# Patient Record
Sex: Male | Born: 1935 | Race: White | Hispanic: No
Health system: Southern US, Community
[De-identification: ages and names within clinical notes are randomized; demographics above are authoritative.]

---

## 2006-01-06 ENCOUNTER — Ambulatory Visit: Payer: Self-pay | Admitting: Specialist

## 2007-04-12 ENCOUNTER — Ambulatory Visit: Payer: Self-pay | Admitting: Family Medicine

## 2009-04-16 ENCOUNTER — Ambulatory Visit: Payer: Self-pay | Admitting: Family Medicine

## 2011-03-18 ENCOUNTER — Ambulatory Visit: Payer: Self-pay | Admitting: Family Medicine

## 2011-04-13 ENCOUNTER — Emergency Department: Payer: Self-pay | Admitting: Emergency Medicine

## 2011-07-14 ENCOUNTER — Ambulatory Visit: Payer: Self-pay | Admitting: Family Medicine

## 2011-08-21 ENCOUNTER — Ambulatory Visit: Payer: Self-pay | Admitting: Family Medicine

## 2011-08-28 ENCOUNTER — Ambulatory Visit: Payer: Self-pay | Admitting: Family Medicine

## 2011-09-01 ENCOUNTER — Emergency Department: Payer: Self-pay | Admitting: Emergency Medicine

## 2011-10-01 ENCOUNTER — Encounter: Payer: Self-pay | Admitting: Family Medicine

## 2011-12-01 ENCOUNTER — Ambulatory Visit: Payer: Self-pay | Admitting: Family Medicine

## 2012-05-19 ENCOUNTER — Emergency Department: Payer: Self-pay | Admitting: Emergency Medicine

## 2012-05-19 LAB — COMPREHENSIVE METABOLIC PANEL
Albumin: 2.9 g/dL — ABNORMAL LOW (ref 3.4–5.0)
Alkaline Phosphatase: 82 U/L (ref 50–136)
Anion Gap: 8 (ref 7–16)
Bilirubin,Total: 0.3 mg/dL (ref 0.2–1.0)
Chloride: 110 mmol/L — ABNORMAL HIGH (ref 98–107)
Co2: 25 mmol/L (ref 21–32)
Creatinine: 0.94 mg/dL (ref 0.60–1.30)
EGFR (African American): >60
Glucose: 127 mg/dL — ABNORMAL HIGH (ref 65–99)
Osmolality: 288 (ref 275–301)
Potassium: 4 mmol/L (ref 3.5–5.1)
SGPT (ALT): 10 U/L — ABNORMAL LOW
Total Protein: 6.2 g/dL — ABNORMAL LOW (ref 6.4–8.2)

## 2012-05-19 LAB — APTT: Activated PTT: 35.3 secs (ref 23.6–35.9)

## 2012-05-19 LAB — CBC
HCT: 34.3 % — ABNORMAL LOW (ref 40.0–52.0)
HGB: 11.5 g/dL — ABNORMAL LOW (ref 13.0–18.0)
Platelet: 124 10*3/uL — ABNORMAL LOW (ref 150–440)

## 2012-05-19 LAB — PROTIME-INR: Prothrombin Time: 14.2 secs (ref 11.5–14.7)

## 2012-06-07 ENCOUNTER — Ambulatory Visit: Payer: Self-pay | Admitting: Specialist

## 2012-06-07 LAB — CREATININE, SERUM
Creatinine: 0.74 mg/dL (ref 0.60–1.30)
EGFR (African American): >60

## 2012-10-14 ENCOUNTER — Emergency Department: Payer: Self-pay | Admitting: Internal Medicine

## 2012-10-14 LAB — COMPREHENSIVE METABOLIC PANEL
Alkaline Phosphatase: 87 U/L (ref 50–136)
Anion Gap: 8 (ref 7–16)
Bilirubin,Total: 0.5 mg/dL (ref 0.2–1.0)
Calcium, Total: 8 mg/dL — ABNORMAL LOW (ref 8.5–10.1)
Co2: 27 mmol/L (ref 21–32)
EGFR (African American): >60
EGFR (Non-African Amer.): >60
Glucose: 119 mg/dL — ABNORMAL HIGH (ref 65–99)
Osmolality: 285 (ref 275–301)
Sodium: 142 mmol/L (ref 136–145)

## 2012-10-14 LAB — CBC
HCT: 35.6 % — ABNORMAL LOW (ref 40.0–52.0)
MCH: 31 pg (ref 26.0–34.0)
MCHC: 34.3 g/dL (ref 32.0–36.0)
MCV: 91 fL (ref 80–100)
Platelet: 96 10*3/uL — ABNORMAL LOW (ref 150–440)
RBC: 3.93 10*6/uL — ABNORMAL LOW (ref 4.40–5.90)
WBC: 2.9 10*3/uL — ABNORMAL LOW (ref 3.8–10.6)

## 2012-10-19 LAB — CULTURE, BLOOD (SINGLE)

## 2013-12-16 LAB — URINALYSIS, COMPLETE
Bacteria: NONE SEEN
Glucose,UR: NEGATIVE mg/dL (ref 0–75)
Protein: NEGATIVE
RBC,UR: 19 /HPF (ref 0–5)
Specific Gravity: 1.016 (ref 1.003–1.030)
Squamous Epithelial: NONE SEEN
WBC UR: 1 /HPF (ref 0–5)

## 2013-12-16 LAB — CBC
HCT: 36.2 % — ABNORMAL LOW (ref 40.0–52.0)
MCH: 31.2 pg (ref 26.0–34.0)
Platelet: 100 10*3/uL — ABNORMAL LOW (ref 150–440)
RDW: 14.6 % — ABNORMAL HIGH (ref 11.5–14.5)
WBC: 8.1 10*3/uL (ref 3.8–10.6)

## 2013-12-16 LAB — COMPREHENSIVE METABOLIC PANEL
Albumin: 3.3 g/dL — ABNORMAL LOW (ref 3.4–5.0)
Alkaline Phosphatase: 65 U/L
Anion Gap: 5 — ABNORMAL LOW (ref 7–16)
BUN: 15 mg/dL (ref 7–18)
Bilirubin,Total: 0.5 mg/dL (ref 0.2–1.0)
Creatinine: 1.09 mg/dL (ref 0.60–1.30)
EGFR (African American): >60
EGFR (Non-African Amer.): 60 — ABNORMAL LOW
Potassium: 4.8 mmol/L (ref 3.5–5.1)
SGPT (ALT): 12 U/L (ref 12–78)
Total Protein: 7 g/dL (ref 6.4–8.2)

## 2013-12-17 ENCOUNTER — Ambulatory Visit: Payer: Self-pay | Admitting: Student

## 2013-12-17 ENCOUNTER — Inpatient Hospital Stay: Payer: Self-pay | Admitting: Internal Medicine

## 2013-12-17 LAB — DIFFERENTIAL
Bands: 6 %
Eosinophil: 1 %
Lymphocytes: 9 %
Metamyelocyte: 3 %
Monocytes: 1 %
Myelocyte: 2 %
Segmented Neutrophils: 78 %

## 2013-12-17 LAB — CBC WITH DIFFERENTIAL/PLATELET
Bands: 13 %
HCT: 34.2 % — ABNORMAL LOW (ref 40.0–52.0)
Lymphocytes: 4 %
MCV: 92 fL (ref 80–100)
Metamyelocyte: 7 %
Monocytes: 1 %
Myelocyte: 2 %
Platelet: 76 10*3/uL — ABNORMAL LOW (ref 150–440)
Segmented Neutrophils: 73 %
WBC: 12 10*3/uL — ABNORMAL HIGH (ref 3.8–10.6)

## 2013-12-18 LAB — URINE CULTURE

## 2013-12-18 LAB — CBC WITH DIFFERENTIAL/PLATELET
Bands: 5 %
HCT: 28.9 % — ABNORMAL LOW (ref 40.0–52.0)
HGB: 9.8 g/dL — ABNORMAL LOW (ref 13.0–18.0)
Lymphocytes: 3 %
MCH: 31.3 pg (ref 26.0–34.0)
MCHC: 33.8 g/dL (ref 32.0–36.0)
MCV: 93 fL (ref 80–100)
Metamyelocyte: 5 %
Myelocyte: 2 %
Platelet: 70 10*3/uL — ABNORMAL LOW (ref 150–440)
RDW: 14.7 % — ABNORMAL HIGH (ref 11.5–14.5)
Segmented Neutrophils: 85 %

## 2013-12-18 LAB — RAPID INFLUENZA A&B ANTIGENS

## 2013-12-18 LAB — COMPREHENSIVE METABOLIC PANEL
Albumin: 2.4 g/dL — ABNORMAL LOW (ref 3.4–5.0)
Alkaline Phosphatase: 44 U/L — ABNORMAL LOW
BUN: 15 mg/dL (ref 7–18)
Bilirubin,Total: 0.4 mg/dL (ref 0.2–1.0)
Calcium, Total: 7.1 mg/dL — ABNORMAL LOW (ref 8.5–10.1)
Chloride: 107 mmol/L (ref 98–107)
Creatinine: 1.04 mg/dL (ref 0.60–1.30)
EGFR (Non-African Amer.): >60
Glucose: 119 mg/dL — ABNORMAL HIGH (ref 65–99)
Osmolality: 274 (ref 275–301)
Potassium: 3.5 mmol/L (ref 3.5–5.1)
SGOT(AST): 18 U/L (ref 15–37)
Sodium: 136 mmol/L (ref 136–145)
Total Protein: 5.5 g/dL — ABNORMAL LOW (ref 6.4–8.2)

## 2013-12-18 LAB — TSH: Thyroid Stimulating Horm: 1.9 u[IU]/mL

## 2013-12-19 LAB — CBC WITH DIFFERENTIAL/PLATELET
Comment - H1-Com4: NORMAL
HCT: 26.9 % — ABNORMAL LOW (ref 40.0–52.0)
HGB: 9.3 g/dL — ABNORMAL LOW (ref 13.0–18.0)
Lymphocytes: 31 %
MCHC: 34.5 g/dL (ref 32.0–36.0)
MCV: 91 fL (ref 80–100)
Monocytes: 1 %
Platelet: 65 10*3/uL — ABNORMAL LOW (ref 150–440)
RDW: 14.6 % — ABNORMAL HIGH (ref 11.5–14.5)
Segmented Neutrophils: 62 %
Variant Lymphocyte - H1-Rlymph: 1 %
WBC: 5.5 10*3/uL (ref 3.8–10.6)

## 2013-12-22 LAB — CULTURE, BLOOD (SINGLE)

## 2014-01-19 ENCOUNTER — Ambulatory Visit: Payer: Self-pay | Admitting: Oncology

## 2014-01-19 LAB — CBC CANCER CENTER
BASOS PCT: 0.4 %
Basophil #: 0 x10 3/mm (ref 0.0–0.1)
EOS ABS: 0 x10 3/mm (ref 0.0–0.7)
EOS PCT: 0.6 %
HCT: 30.4 % — AB (ref 40.0–52.0)
HGB: 9.8 g/dL — ABNORMAL LOW (ref 13.0–18.0)
LYMPHS PCT: 16.7 %
Lymphocyte #: 1 x10 3/mm (ref 1.0–3.6)
MCH: 30.5 pg (ref 26.0–34.0)
MCHC: 32.2 g/dL (ref 32.0–36.0)
MCV: 95 fL (ref 80–100)
MONO ABS: 0.2 x10 3/mm (ref 0.2–1.0)
MONOS PCT: 2.9 %
NEUTROS PCT: 79.4 %
Neutrophil #: 4.6 x10 3/mm (ref 1.4–6.5)
Platelet: 89 x10 3/mm — ABNORMAL LOW (ref 150–440)
RBC: 3.21 10*6/uL — ABNORMAL LOW (ref 4.40–5.90)
RDW: 16 % — ABNORMAL HIGH (ref 11.5–14.5)
WBC: 5.8 x10 3/mm (ref 3.8–10.6)

## 2014-01-22 ENCOUNTER — Ambulatory Visit: Payer: Self-pay | Admitting: Oncology

## 2014-03-15 ENCOUNTER — Ambulatory Visit: Payer: Self-pay | Admitting: Oncology

## 2014-03-29 ENCOUNTER — Ambulatory Visit: Payer: Self-pay | Admitting: Oncology

## 2014-03-30 LAB — CBC CANCER CENTER
Basophil #: 0 x10 3/mm (ref 0.0–0.1)
Basophil %: 0.8 %
Eosinophil #: 0 x10 3/mm (ref 0.0–0.7)
Eosinophil %: 0.7 %
HCT: 35.6 % — AB (ref 40.0–52.0)
HGB: 11.5 g/dL — AB (ref 13.0–18.0)
LYMPHS PCT: 30.1 %
Lymphocyte #: 1.3 x10 3/mm (ref 1.0–3.6)
MCH: 30.1 pg (ref 26.0–34.0)
MCHC: 32.2 g/dL (ref 32.0–36.0)
MCV: 93 fL (ref 80–100)
Monocyte #: 0.1 x10 3/mm — ABNORMAL LOW (ref 0.2–1.0)
Monocyte %: 2.5 %
NEUTROS ABS: 2.8 x10 3/mm (ref 1.4–6.5)
Neutrophil %: 65.9 %
Platelet: 78 x10 3/mm — ABNORMAL LOW (ref 150–440)
RBC: 3.82 10*6/uL — ABNORMAL LOW (ref 4.40–5.90)
RDW: 15.2 % — ABNORMAL HIGH (ref 11.5–14.5)
WBC: 4.3 x10 3/mm (ref 3.8–10.6)

## 2014-03-30 LAB — IRON AND TIBC
IRON BIND. CAP.(TOTAL): 419 ug/dL (ref 250–450)
IRON: 90 ug/dL (ref 65–175)
Iron Saturation: 21 %
Unbound Iron-Bind.Cap.: 329 ug/dL

## 2014-03-30 LAB — FOLATE: Folic Acid: 4.8 ng/mL (ref 3.1–100.0)

## 2014-03-30 LAB — FERRITIN: FERRITIN (ARMC): 110 ng/mL (ref 8–388)

## 2014-03-30 LAB — LACTATE DEHYDROGENASE: LDH: 173 U/L (ref 85–241)

## 2014-04-21 ENCOUNTER — Ambulatory Visit: Payer: Self-pay | Admitting: Oncology

## 2014-04-25 ENCOUNTER — Ambulatory Visit: Payer: Self-pay | Admitting: Family Medicine

## 2014-05-12 ENCOUNTER — Inpatient Hospital Stay: Payer: Self-pay | Admitting: Internal Medicine

## 2014-05-12 LAB — CBC WITH DIFFERENTIAL/PLATELET
BASOS ABS: 0 10*3/uL (ref 0.0–0.1)
Basophil %: 0.1 %
Eosinophil #: 0 10*3/uL (ref 0.0–0.7)
Eosinophil %: 0.1 %
HCT: 38.3 % — ABNORMAL LOW (ref 40.0–52.0)
HGB: 12.1 g/dL — AB (ref 13.0–18.0)
Lymphocyte #: 0.5 10*3/uL — ABNORMAL LOW (ref 1.0–3.6)
Lymphocyte %: 2.6 %
MCH: 30.4 pg (ref 26.0–34.0)
MCHC: 31.7 g/dL — ABNORMAL LOW (ref 32.0–36.0)
MCV: 96 fL (ref 80–100)
MONO ABS: 0.1 x10 3/mm — AB (ref 0.2–1.0)
Monocyte %: 0.8 %
Neutrophil #: 18.2 10*3/uL — ABNORMAL HIGH (ref 1.4–6.5)
Neutrophil %: 96.4 %
Platelet: 90 10*3/uL — ABNORMAL LOW (ref 150–440)
RBC: 3.99 10*6/uL — AB (ref 4.40–5.90)
RDW: 15.4 % — ABNORMAL HIGH (ref 11.5–14.5)
WBC: 18.8 10*3/uL — ABNORMAL HIGH (ref 3.8–10.6)

## 2014-05-12 LAB — COMPREHENSIVE METABOLIC PANEL
ALBUMIN: 3 g/dL — AB (ref 3.4–5.0)
AST: 22 U/L (ref 15–37)
Alkaline Phosphatase: 66 U/L
Anion Gap: 6 — ABNORMAL LOW (ref 7–16)
BILIRUBIN TOTAL: 0.4 mg/dL (ref 0.2–1.0)
BUN: 20 mg/dL — ABNORMAL HIGH (ref 7–18)
CREATININE: 1.4 mg/dL — AB (ref 0.60–1.30)
Calcium, Total: 7.5 mg/dL — ABNORMAL LOW (ref 8.5–10.1)
Chloride: 109 mmol/L — ABNORMAL HIGH (ref 98–107)
Co2: 25 mmol/L (ref 21–32)
EGFR (African American): 51 — ABNORMAL LOW
EGFR (Non-African Amer.): 44 — ABNORMAL LOW
GLUCOSE: 103 mg/dL — AB (ref 65–99)
Osmolality: 282 (ref 275–301)
POTASSIUM: 3.9 mmol/L (ref 3.5–5.1)
SGPT (ALT): 8 U/L — ABNORMAL LOW (ref 12–78)
Sodium: 140 mmol/L (ref 136–145)
TOTAL PROTEIN: 6.4 g/dL (ref 6.4–8.2)

## 2014-05-12 LAB — CK-MB
CK-MB: 0.8 ng/mL (ref 0.5–3.6)
CK-MB: 0.9 ng/mL (ref 0.5–3.6)

## 2014-05-12 LAB — PRO B NATRIURETIC PEPTIDE: B-TYPE NATIURETIC PEPTID: 890 pg/mL — AB (ref 0–450)

## 2014-05-12 LAB — GENTAMICIN LEVEL, TROUGH: Gentamicin, Trough: 16.9 ug/mL (ref 0.0–2.0)

## 2014-05-12 LAB — TROPONIN I
Troponin-I: <0.02 ng/mL
Troponin-I: <0.02 ng/mL

## 2014-05-13 LAB — CBC WITH DIFFERENTIAL/PLATELET
BASOS ABS: 0.1 10*3/uL (ref 0.0–0.1)
BASOS PCT: 0.2 %
Eosinophil #: 0 10*3/uL (ref 0.0–0.7)
Eosinophil %: 0.1 %
HCT: 29.2 % — ABNORMAL LOW (ref 40.0–52.0)
HGB: 9.4 g/dL — ABNORMAL LOW (ref 13.0–18.0)
Lymphocyte #: 1.5 10*3/uL (ref 1.0–3.6)
Lymphocyte %: 4.8 %
MCH: 30.7 pg (ref 26.0–34.0)
MCHC: 32.1 g/dL (ref 32.0–36.0)
MCV: 96 fL (ref 80–100)
MONO ABS: 0.3 x10 3/mm (ref 0.2–1.0)
Monocyte %: 1 %
NEUTROS ABS: 30.1 10*3/uL — AB (ref 1.4–6.5)
NEUTROS PCT: 93.9 %
Platelet: 85 10*3/uL — ABNORMAL LOW (ref 150–440)
RBC: 3.06 10*6/uL — ABNORMAL LOW (ref 4.40–5.90)
RDW: 15.6 % — ABNORMAL HIGH (ref 11.5–14.5)
WBC: 32.1 10*3/uL — ABNORMAL HIGH (ref 3.8–10.6)

## 2014-05-13 LAB — MAGNESIUM: Magnesium: 2 mg/dL

## 2014-05-13 LAB — BASIC METABOLIC PANEL
Anion Gap: 5 — ABNORMAL LOW (ref 7–16)
BUN: 22 mg/dL — ABNORMAL HIGH (ref 7–18)
CO2: 23 mmol/L (ref 21–32)
Calcium, Total: 7.5 mg/dL — ABNORMAL LOW (ref 8.5–10.1)
Chloride: 106 mmol/L (ref 98–107)
Creatinine: 1.42 mg/dL — ABNORMAL HIGH (ref 0.60–1.30)
GFR CALC AF AMER: 50 — AB
GFR CALC NON AF AMER: 43 — AB
Glucose: 83 mg/dL (ref 65–99)
Osmolality: 271 (ref 275–301)
Potassium: 3.8 mmol/L (ref 3.5–5.1)
Sodium: 134 mmol/L — ABNORMAL LOW (ref 136–145)

## 2014-05-14 LAB — CBC WITH DIFFERENTIAL/PLATELET
BASOS PCT: 0.1 %
Basophil #: 0 10*3/uL (ref 0.0–0.1)
EOS PCT: 0.1 %
Eosinophil #: 0 10*3/uL (ref 0.0–0.7)
HCT: 27.1 % — AB (ref 40.0–52.0)
HGB: 8.8 g/dL — ABNORMAL LOW (ref 13.0–18.0)
LYMPHS ABS: 0.8 10*3/uL — AB (ref 1.0–3.6)
Lymphocyte %: 5.7 %
MCH: 30.8 pg (ref 26.0–34.0)
MCHC: 32.5 g/dL (ref 32.0–36.0)
MCV: 95 fL (ref 80–100)
MONO ABS: 0.2 x10 3/mm (ref 0.2–1.0)
Monocyte %: 1.2 %
NEUTROS ABS: 12.8 10*3/uL — AB (ref 1.4–6.5)
NEUTROS PCT: 92.9 %
Platelet: 76 10*3/uL — ABNORMAL LOW (ref 150–440)
RBC: 2.86 10*6/uL — ABNORMAL LOW (ref 4.40–5.90)
RDW: 15.6 % — AB (ref 11.5–14.5)
WBC: 13.8 10*3/uL — ABNORMAL HIGH (ref 3.8–10.6)

## 2014-05-14 LAB — BASIC METABOLIC PANEL
ANION GAP: 1 — AB (ref 7–16)
BUN: 16 mg/dL (ref 7–18)
CREATININE: 1.14 mg/dL (ref 0.60–1.30)
Calcium, Total: 7.7 mg/dL — ABNORMAL LOW (ref 8.5–10.1)
Chloride: 112 mmol/L — ABNORMAL HIGH (ref 98–107)
Co2: 24 mmol/L (ref 21–32)
EGFR (African American): >60
GFR CALC NON AF AMER: 56 — AB
Glucose: 88 mg/dL (ref 65–99)
Osmolality: 274 (ref 275–301)
Potassium: 4 mmol/L (ref 3.5–5.1)
Sodium: 137 mmol/L (ref 136–145)

## 2014-05-14 LAB — GENTAMICIN LEVEL, RANDOM: GENTAMICIN, RANDOM: 0.5 ug/mL

## 2014-05-16 LAB — CBC WITH DIFFERENTIAL/PLATELET
Bands: 3 %
EOS PCT: 1 %
HCT: 27 % — ABNORMAL LOW (ref 40.0–52.0)
HGB: 9.1 g/dL — ABNORMAL LOW (ref 13.0–18.0)
Lymphocytes: 7 %
MCH: 32 pg (ref 26.0–34.0)
MCHC: 33.9 g/dL (ref 32.0–36.0)
MCV: 95 fL (ref 80–100)
METAMYELOCYTE: 5 %
MONOS PCT: 2 %
MYELOCYTE: 2 %
Platelet: 85 10*3/uL — ABNORMAL LOW (ref 150–440)
RBC: 2.85 10*6/uL — AB (ref 4.40–5.90)
RDW: 15.3 % — ABNORMAL HIGH (ref 11.5–14.5)
Segmented Neutrophils: 80 %
WBC: 9.3 10*3/uL (ref 3.8–10.6)

## 2014-05-16 LAB — BASIC METABOLIC PANEL
Anion Gap: 5 — ABNORMAL LOW (ref 7–16)
BUN: 8 mg/dL (ref 7–18)
CALCIUM: 7.8 mg/dL — AB (ref 8.5–10.1)
CO2: 25 mmol/L (ref 21–32)
Chloride: 109 mmol/L — ABNORMAL HIGH (ref 98–107)
Creatinine: 1.02 mg/dL (ref 0.60–1.30)
EGFR (African American): >60
EGFR (Non-African Amer.): >60
GLUCOSE: 105 mg/dL — AB (ref 65–99)
OSMOLALITY: 276 (ref 275–301)
Potassium: 3.4 mmol/L — ABNORMAL LOW (ref 3.5–5.1)
Sodium: 139 mmol/L (ref 136–145)

## 2014-05-16 LAB — EXPECTORATED SPUTUM ASSESSMENT W REFEX TO RESP CULTURE

## 2014-05-17 LAB — CULTURE, BLOOD (SINGLE)

## 2014-05-22 ENCOUNTER — Ambulatory Visit: Payer: Self-pay | Admitting: Oncology

## 2014-08-22 ENCOUNTER — Ambulatory Visit: Payer: Self-pay | Admitting: Internal Medicine

## 2014-09-06 ENCOUNTER — Inpatient Hospital Stay: Payer: Self-pay | Admitting: Internal Medicine

## 2014-09-06 LAB — COMPREHENSIVE METABOLIC PANEL
ALK PHOS: 105 U/L
Albumin: 3 g/dL — ABNORMAL LOW (ref 3.4–5.0)
Anion Gap: 9 (ref 7–16)
BUN: 18 mg/dL (ref 7–18)
Bilirubin,Total: 0.5 mg/dL (ref 0.2–1.0)
CHLORIDE: 106 mmol/L (ref 98–107)
Calcium, Total: 8.5 mg/dL (ref 8.5–10.1)
Co2: 21 mmol/L (ref 21–32)
Creatinine: 1.1 mg/dL (ref 0.60–1.30)
EGFR (African American): >60
GFR CALC NON AF AMER: 59 — AB
GLUCOSE: 135 mg/dL — AB (ref 65–99)
Osmolality: 276 (ref 275–301)
POTASSIUM: 4.9 mmol/L (ref 3.5–5.1)
SGOT(AST): 27 U/L (ref 15–37)
SGPT (ALT): 11 U/L — ABNORMAL LOW
Sodium: 136 mmol/L (ref 136–145)
Total Protein: 7.7 g/dL (ref 6.4–8.2)

## 2014-09-06 LAB — URINALYSIS, COMPLETE
BLOOD: NEGATIVE
Bilirubin,UR: NEGATIVE
Glucose,UR: NEGATIVE mg/dL (ref 0–75)
Hyaline Cast: 49
Ketone: NEGATIVE
Leukocyte Esterase: NEGATIVE
NITRITE: NEGATIVE
PROTEIN: NEGATIVE
Ph: 5 (ref 4.5–8.0)
RBC,UR: 2 /HPF (ref 0–5)
Specific Gravity: 1.016 (ref 1.003–1.030)
Squamous Epithelial: NONE SEEN

## 2014-09-06 LAB — CBC
HCT: 43.8 % (ref 40.0–52.0)
HGB: 14.1 g/dL (ref 13.0–18.0)
MCH: 30.3 pg (ref 26.0–34.0)
MCHC: 32.2 g/dL (ref 32.0–36.0)
MCV: 94 fL (ref 80–100)
Platelet: 161 10*3/uL (ref 150–440)
RBC: 4.66 10*6/uL (ref 4.40–5.90)
RDW: 15.7 % — ABNORMAL HIGH (ref 11.5–14.5)
WBC: 29.3 10*3/uL — ABNORMAL HIGH (ref 3.8–10.6)

## 2014-09-06 LAB — PROTIME-INR
INR: 1.2
Prothrombin Time: 15.1 secs — ABNORMAL HIGH (ref 11.5–14.7)

## 2014-09-06 LAB — MAGNESIUM: Magnesium: 2.1 mg/dL

## 2014-09-06 LAB — TROPONIN I: Troponin-I: <0.02 ng/mL

## 2014-09-06 LAB — PHOSPHORUS: Phosphorus: 2.9 mg/dL (ref 2.5–4.9)

## 2014-09-06 LAB — APTT: Activated PTT: 31.3 secs (ref 23.6–35.9)

## 2014-09-07 LAB — BASIC METABOLIC PANEL
ANION GAP: 7 (ref 7–16)
BUN: 16 mg/dL (ref 7–18)
Calcium, Total: 7.3 mg/dL — ABNORMAL LOW (ref 8.5–10.1)
Chloride: 109 mmol/L — ABNORMAL HIGH (ref 98–107)
Co2: 22 mmol/L (ref 21–32)
Creatinine: 0.8 mg/dL (ref 0.60–1.30)
EGFR (African American): >60
Glucose: 99 mg/dL (ref 65–99)
Osmolality: 277 (ref 275–301)
Potassium: 4.1 mmol/L (ref 3.5–5.1)
SODIUM: 138 mmol/L (ref 136–145)

## 2014-09-07 LAB — CBC WITH DIFFERENTIAL/PLATELET
Comment - H1-Com2: NORMAL
HCT: 31.4 % — ABNORMAL LOW (ref 40.0–52.0)
HGB: 10 g/dL — AB (ref 13.0–18.0)
Lymphocytes: 4 %
MCH: 30.2 pg (ref 26.0–34.0)
MCHC: 32 g/dL (ref 32.0–36.0)
MCV: 94 fL (ref 80–100)
Monocytes: 2 %
Myelocyte: 1 %
PLATELETS: 120 10*3/uL — AB (ref 150–440)
RBC: 3.33 10*6/uL — ABNORMAL LOW (ref 4.40–5.90)
RDW: 15.6 % — ABNORMAL HIGH (ref 11.5–14.5)
SEGMENTED NEUTROPHILS: 93 %
WBC: 22 10*3/uL — ABNORMAL HIGH (ref 3.8–10.6)

## 2014-09-08 LAB — CBC WITH DIFFERENTIAL/PLATELET
Basophil #: 0 10*3/uL (ref 0.0–0.1)
Basophil %: 0.2 %
EOS ABS: 0.1 10*3/uL (ref 0.0–0.7)
Eosinophil %: 0.5 %
HCT: 30.6 % — ABNORMAL LOW (ref 40.0–52.0)
HGB: 10.1 g/dL — ABNORMAL LOW (ref 13.0–18.0)
LYMPHS ABS: 1.1 10*3/uL (ref 1.0–3.6)
Lymphocyte %: 11.4 %
MCH: 31 pg (ref 26.0–34.0)
MCHC: 33 g/dL (ref 32.0–36.0)
MCV: 94 fL (ref 80–100)
Monocyte #: 0.2 x10 3/mm (ref 0.2–1.0)
Monocyte %: 1.7 %
Neutrophil #: 8.3 10*3/uL — ABNORMAL HIGH (ref 1.4–6.5)
Neutrophil %: 86.2 %
PLATELETS: 104 10*3/uL — AB (ref 150–440)
RBC: 3.26 10*6/uL — ABNORMAL LOW (ref 4.40–5.90)
RDW: 15.8 % — ABNORMAL HIGH (ref 11.5–14.5)
WBC: 9.6 10*3/uL (ref 3.8–10.6)

## 2014-09-08 LAB — ALBUMIN: Albumin: 2.1 g/dL — ABNORMAL LOW (ref 3.4–5.0)

## 2014-09-08 LAB — URINE CULTURE

## 2014-09-11 LAB — CULTURE, BLOOD (SINGLE)

## 2014-09-21 ENCOUNTER — Ambulatory Visit: Payer: Self-pay | Admitting: Internal Medicine

## 2014-11-26 IMAGING — CR DG CHEST 2V
1 series · 2 of 2 positions shown · non-contrast
Comparison: 12/16/2013

CLINICAL DATA: Fever

EXAM:
CHEST  2 VIEW

[Series 1: x chest ap · 0.14mm/px · 2 of 2 slices shown]
[im 1/2]
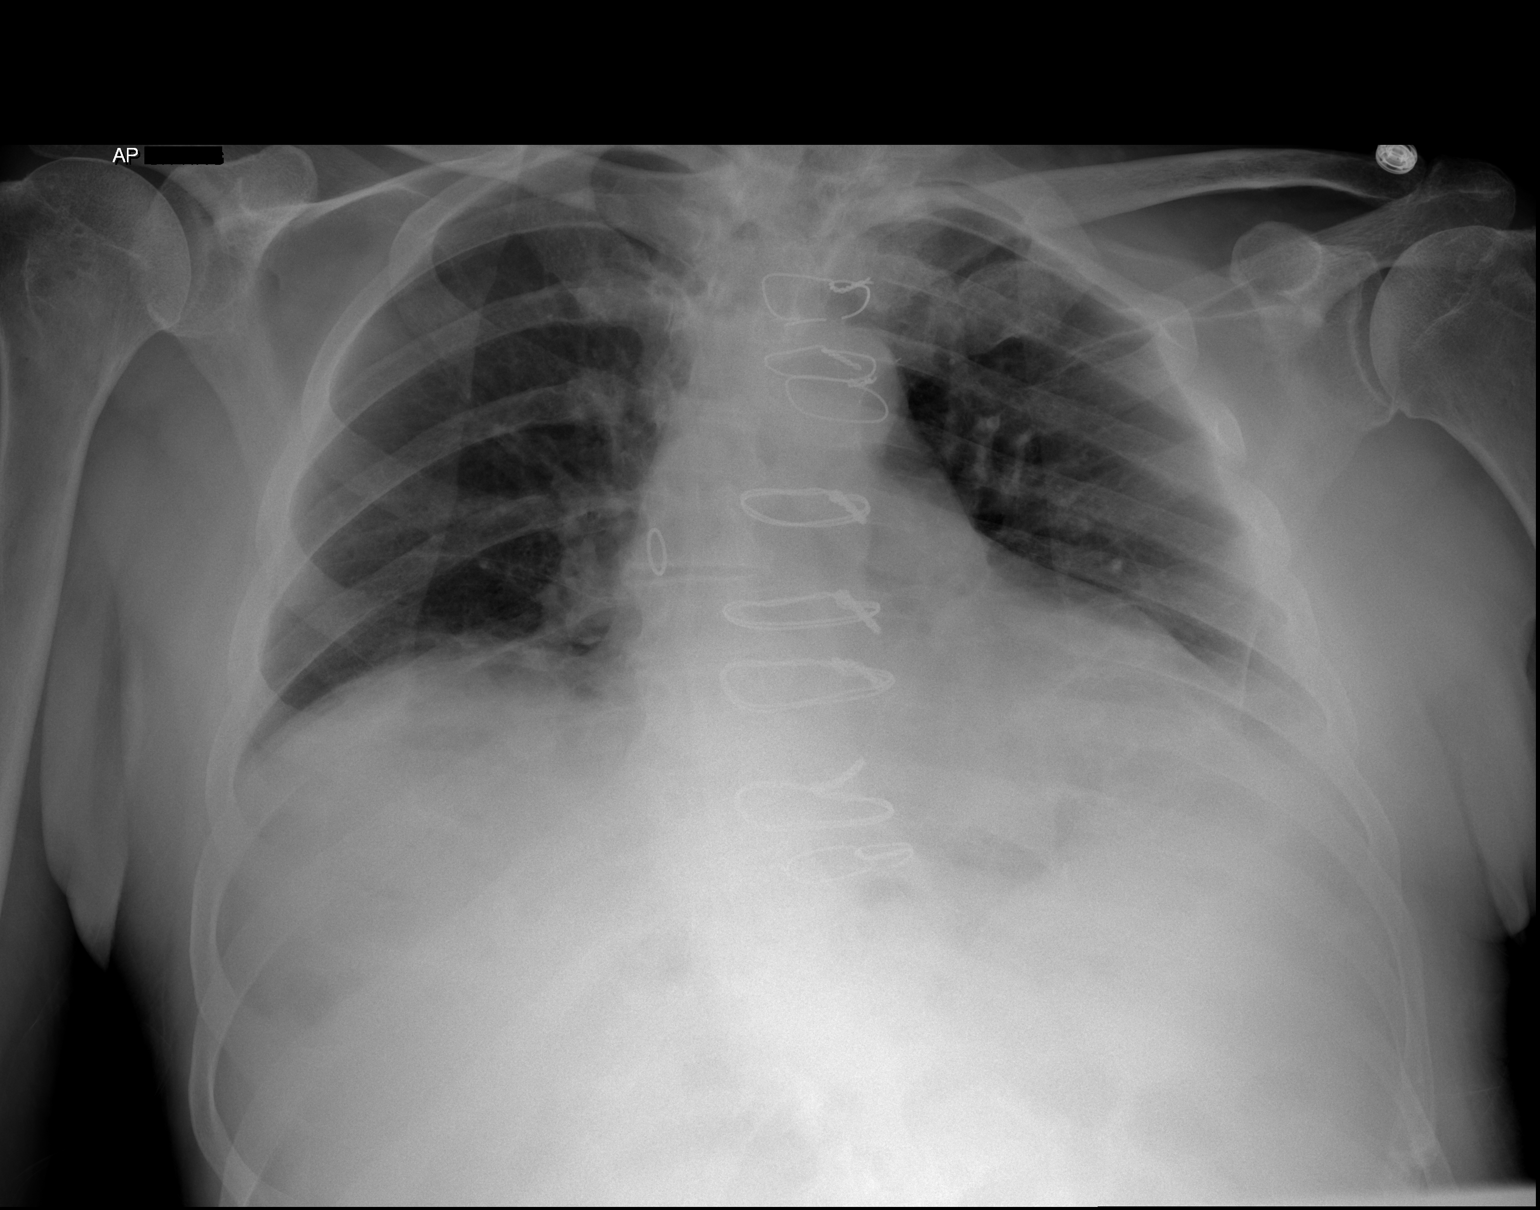
[im 2/2]
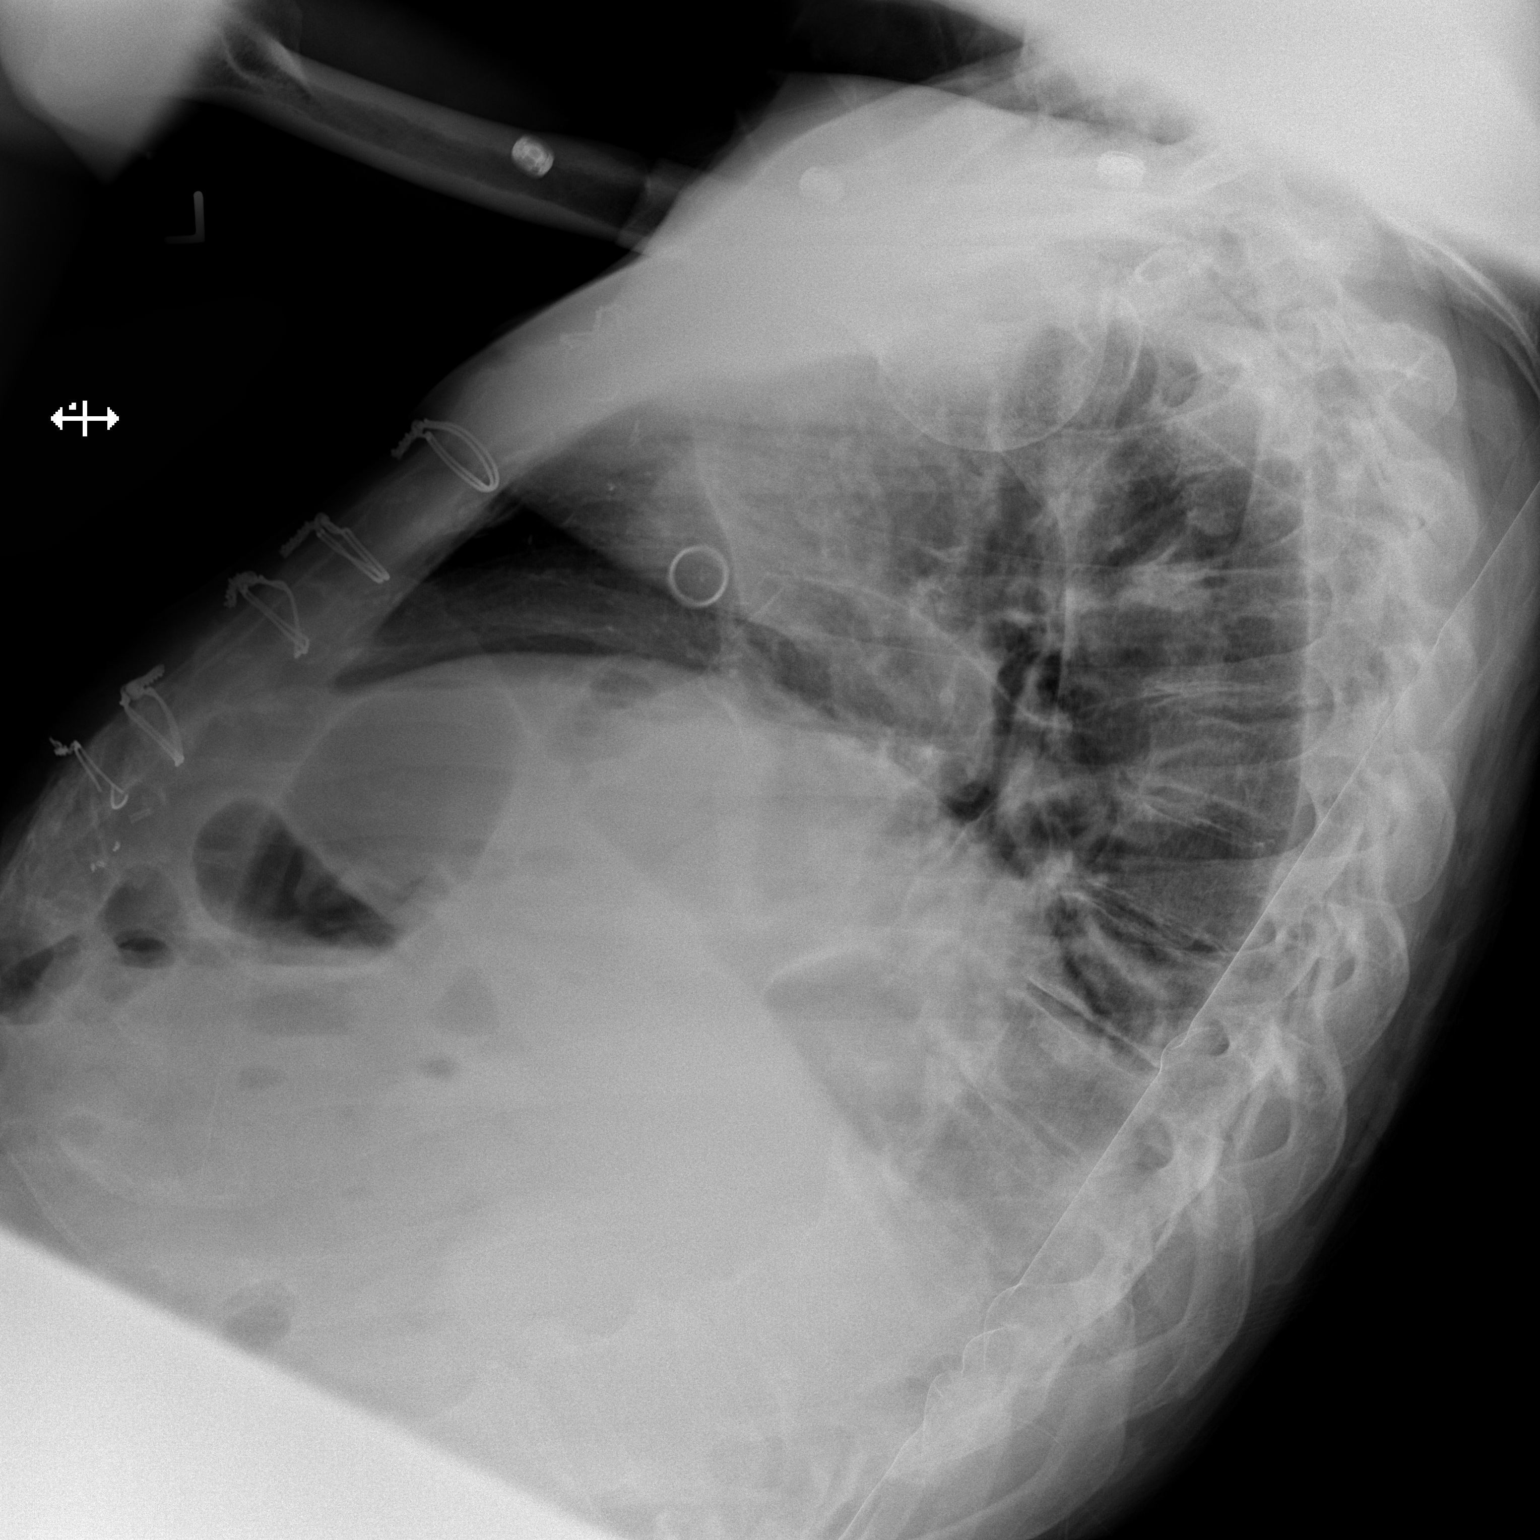

[2 of 2 positions shown; findings below may reference images not displayed]

FINDINGS: Borderline enlargement of cardiac silhouette post median sternotomy.

Mediastinal contours and pulmonary vascularity normal.

Minimal bibasilar atelectasis.

Lungs otherwise clear.

No pleural effusion or pneumothorax.

No acute osseous findings.
IMPRESSION: Post median sternotomy.

Minimal bibasilar atelectasis.

## 2014-12-22 DEATH — deceased

## 2015-04-13 NOTE — Consult Note (Signed)
Admit Diagnosis:   FEVER WITH CHILLS: Onset Date: 17-Dec-2013, Status: Active, Description: FEVER WITH CHILLS    copd:    cad:    Polio (as child):    Triple bypass:   Home Medications: Medication Instructions Status  donepezil 10 mg oral tablet 1 tab(s) orally once a day Active  doxazosin 2 mg oral tablet 1 tab(s) orally once a day Active  metoprolol succinate 25 mg oral tablet, extended release 1 tab(s) orally once a day Active  gemfibrozil 600 mg oral tablet 1 tab(s) orally 2 times a day Active  sertraline 100 mg oral tablet 1 tab(s) orally once a day Active  amitriptyline 25 mg oral tablet 1 tab(s) orally once a day (at night) Active  clopidogrel 75 mg oral tablet 1 tab(s) orally once a day Active   Lab Results: Hepatic:  26-Dec-14 21:28   Bilirubin, Total 0.5  Alkaline Phosphatase 65 (45-117 NOTE: New Reference Range 11/11/13)  SGPT (ALT) 12  SGOT (AST) 27  Total Protein, Serum 7.0  Albumin, Serum  3.3  Routine Micro:  26-Dec-14 23:28   Micro Text Report INFLUENZA A+B ANTIGENS   COMMENT                   NEGATIVE FOR INFLUENZA A (ANTIGEN ABSENT)   COMMENT                   NEGATIVE FOR INFLUENZA B (ANTIGEN ABSENT)   ANTIBIOTIC                       Comment 1.. NEGATIVE FOR INFLUENZA A (ANTIGEN ABSENT) A negative result does not exclude influenza. Correlation with clinical impression is required.  Comment 2.. NEGATIVE FOR INFLUENZA B (ANTIGEN ABSENT)  Result(s) reported on 16 Dec 2013 at 11:56PM.  27-Dec-14 02:03   Micro Text Report BLOOD CULTURE   COMMENT                   NO GROWTH IN 8-12 HOURS   ANTIBIOTIC                       Micro Text Report BLOOD CULTURE   COMMENT                   NO GROWTH IN 8-12 HOURS   ANTIBIOTIC                       Culture Comment NO GROWTH IN 8-12 HOURS  Result(s) reported on 17 Dec 2013 at 10:00AM.  Culture Comment NO GROWTH IN 8-12 HOURS  Result(s) reported on 17 Dec 2013 at 10:00AM.  Routine Chem:  26-Dec-14  21:28   Sodium, Serum  132  Result Comment POTASSIUM/AST - Slight hemolysis, interpret results with  - caution.  Result(s) reported on 16 Dec 2013 at 10:01PM.  Glucose, Serum  105  BUN 15  Creatinine (comp) 1.09  Potassium, Serum 4.8  Chloride, Serum 102  CO2, Serum 25  Calcium (Total), Serum  7.8  Osmolality (calc) 266  eGFR (African American) >60  eGFR (Non-African American)  60 (eGFR values <98m/min/1.73 m2 may be an indication of chronic kidney disease (CKD). Calculated eGFR is useful in patients with stable renal function. The eGFR calculation will not be reliable in acutely ill patients when serum creatinine is changing rapidly. It is not useful in  patients on dialysis. The eGFR calculation may not be applicable to patients  at the low and high extremes of body sizes, pregnant women, and vegetarians.)  Anion Gap  5  27-Dec-14 09:13   Sodium, Serum 137 (Result(s) reported on 17 Dec 2013 at 09:34AM.)  Cardiac:  26-Dec-14 21:28   Troponin I < 0.02 (0.00-0.05 0.05 ng/mL or less: NEGATIVE  Repeat testing in 3-6 hrs  if clinically indicated. >0.05 ng/mL: POTENTIAL  MYOCARDIAL INJURY. Repeat  testing in 3-6 hrs if  clinically indicated. NOTE: An increase or decrease  of 30% or more on serial  testing suggests a  clinically important change)  Routine UA:  26-Dec-14 21:28   Color (UA) Yellow  Clarity (UA) Clear  Glucose (UA) Negative  Bilirubin (UA) Negative  Ketones (UA) Negative  Specific Gravity (UA) 1.016  Blood (UA) 2+  pH (UA) 6.0  Protein (UA) Negative  Nitrite (UA) Negative  Leukocyte Esterase (UA) Negative (Result(s) reported on 16 Dec 2013 at 10:48PM.)  RBC (UA) 19 /HPF  WBC (UA) 1 /HPF  Bacteria (UA) NONE SEEN  Epithelial Cells (UA) NONE SEEN  Mucous (UA) PRESENT (Result(s) reported on 16 Dec 2013 at 10:48PM.)  Routine Hem:  26-Dec-14 21:28   WBC (CBC) 8.1  RBC (CBC)  3.90  Hemoglobin (CBC)  12.2  Hematocrit (CBC)  36.2  Platelet Count (CBC)   100 (Result(s) reported on 16 Dec 2013 at 09:54PM.)  MCV 93  MCH 31.2  MCHC 33.6  RDW  14.6  Bands 6  Segmented Neutrophils 78  Lymphocytes 9  Monocytes 1  Metamyelocyte 3  Myelocyte 2  Diff Comment 1 ANISOCYTOSIS  Diff Comment 2 PLTS VARIED IN SIZE  Reference Accession# [id number] (Result(s) reported on 17 Dec 2013 at 12:21AM.)  Eosinophil 1  Diff Comment 3 LARGE PLATELETS  Diff Comment 4 STOMATOCYTES  Result(s) reported on 17 Dec 2013 at 12:21AM.  27-Dec-14 09:13   WBC (CBC)  12.0  RBC (CBC)  3.70  Hemoglobin (CBC)  11.5  Hematocrit (CBC)  34.2  Platelet Count (CBC)  76 (Result(s) reported on 17 Dec 2013 at 10:04AM.)  MCV 92  MCH 31.2  MCHC 33.7  RDW 14.4  Bands 13  Segmented Neutrophils 73  Lymphocytes 4  Monocytes 1  Metamyelocyte 7  Myelocyte 2  Diff Comment 1 ANISOCYTOSIS  Diff Comment 2 NORMAL PLT MORPHOLGY  Result(s) reported on 17 Dec 2013 at 10:04AM.   Radiology Results:  Radiology Results: XRay:    26-Dec-14 21:14, Chest Portable Single View  Chest Portable Single View  REASON FOR EXAM:    Cough, fever  COMMENTS:       PROCEDURE: DXR - DXR PORTABLE CHEST SINGLEVIEW  - Dec 16 2013  9:14PM     CLINICAL DATA:  Cough and fever    EXAM:  PORTABLE CHEST - 1 VIEW    COMPARISON:  10/14/2012    FINDINGS:  Previous median sternotomy and CABG procedure. The heart size is  normal. The lung volumes are low. No pleural effusions or edema. No  airspace consolidation.     IMPRESSION:  1. No acute findings.  2. Low lung volumes.      Electronically Signed    By: Kerby Moors M.D.    On: 12/16/2013 21:16         Verified By: Angelita Ingles, M.D.,  CT:    26-Dec-14 23:26, CT Abdomen Pelvis WO for Stone  CT Abdomen Pelvis WO for Stone  REASON FOR EXAM:    right flank pain, hematuria, fever  COMMENTS:  PROCEDURE: CT  - CT ABDOMEN /PELVIS WO (STONE)  - Dec 16 2013 11:26PM     CLINICAL DATA:  Cough, febrile, right flank pain. Hematuria.  History  of chronic cystitis.    EXAM:  CT ABDOMEN AND PELVIS WITHOUT CONTRAST    TECHNIQUE:  Multidetector CT imaging of the abdomen and pelvis was performed  following the standard protocol without IV contrast.  COMPARISON:  CT of the abdomen and pelvis report dated January 06, 2006 though images are not available for direct comparison.    FINDINGS:  Mild respiratory motion degraded evaluation of the lung bases  demonstrate scarring and or atelectasis. Nodular lingular subpleural  density was described on prior examination associated with  atelectasis. Coronary artery calcifications. Heart size is overall  normal. Pericardium is nonsuspicious. Trace left pleural effusion.  Status post median sternotomy.    Small hiatal hernia. Stomach, small bowel are normal in course and  caliber. Colonic interposition, with mild amount of retained large  bowel stool, evaluation of bowel is limited by a lack of contrast  and motion. Sigmoid diverticulosis without superimposed inflammatory  changes. Partially calcified 11 x 7 mm nodule to the left of the  sigmoid colon may reflect lymph node/granuloma. No intraperitoneal  free fluid nor free air.    The liver, spleen, gallbladder and adrenal glands are unremarkable.  Fatty replaced pancreas.    Kidneys are well located, 2 cm exophytic low-density cyst arising  and lower pole left kidney, in addition to lung immediately 3.3 cm  left lower pole cyst. No nephrolithiasis, hydronephrosis. Limited  assessment for solid masses on this nonenhanced examination. Ureters  are normal in course and caliber. Urinary bladder is under  distended, mild wall thickening may be secondary to to  underdistention. Prostate is not enlarged.  Mildly ectatic renal aorta without discrete abdominal aortic  aneurysm, however 17 mm left common iliac artery aneurysm noted.  Moderate calcific atherosclerosis.    Asymmetric right pelvic muscle atrophy. Small right hip  joint  effusion, no associated destructive bony changes. Apparent right hip  dysplasia with shallow acetabulum. Mid lumbar levoscoliosis and  degenerative change.     IMPRESSION:  No urolithiasis.  No acute intra-abdominal nor pelvic process.    Apparent right hip dysplasia, with small right hip effusion, this  could be sampled if clinical concern for septic arthritis though  there are no associated destructive bony changes.  Mild urinary bladder wall thickening, which may reflect patient's  known chronic cystitis, though limited assessment due to under  distention.    17 mm left common iliac artery aneurysm in a background of moderate  calcific atherosclerosis.      Electronically Signed    By: Elon Alas    On: 12/16/2013 23:41         Verified By: Ricky Ala, M.D.,    27-Dec-14 00:01, CT Head Without Contrast  CT Head Without Contrast  REASON FOR EXAM:    AMS/fever  COMMENTS:       PROCEDURE: CT  - CT HEAD WITHOUT CONTRAST  - Dec 17 2013 12:01AM     CLINICAL DATA:  Tired, cough, febrile. Right flank pain. Altered  mental status.    EXAM:  CT HEAD WITHOUT CONTRAST    TECHNIQUE:  Contiguous axial images were obtained from the base of the skull  through the vertex without intravenous contrast.  COMPARISON:  CT of the head May 19, 2012    FINDINGS:  Mild motion degraded examination.  The ventricles and sulci are  normal for age. No intraparenchymal hemorrhage, mass effect nor  midline shift. Patchy supratentorial white matter hypodensities are  within normal range for patient's age and though non-specific  suggest sequelae of chronic small vessel ischemic disease. Remote  left caudate head lacunar infarct, with additional subcentimeter  basal ganglia and right subinsular infarcts, unchanged. No acute  large vascular territory infarcts.    No abnormal extra-axial fluid collections. Basal cisterns are  patent. Mild calcific atherosclerosis of the  carotid siphons.  No skull fracture. Visualized paranasal sinuses and mastoid aircells  are well-aerated. The included ocular globes and orbital contents  are non-suspicious.     IMPRESSION:  No acute intracranial process.    Remote bilateral basal ganglia lacunar infarcts, right subinsular  infarct with moderate white matter changes suggesting chronic small  vessel ischemic disease.      Electronically Signed    By: Elon Alas    On: 12/17/2013 00:25     Verified By: Ricky Ala, M.D.,    [place]: Agitation  Nursing Flowsheets: **Vital Signs.:   27-Dec-14 21:22  Vital Signs Type Routine  Temperature Temperature (F) 97.5  Celsius 36.3  Temperature Source oral  Respirations Respirations 18  Systolic BP Systolic BP 99  Diastolic BP (mmHg) Diastolic BP (mmHg) 49  Mean BP 65  Pulse Ox % Pulse Ox % 96  Pulse Ox Activity Level  At rest  Oxygen Delivery Room Air/ 21 %  *Intake and Output.:   Shift 27-Dec-14 23:00  Grand Totals Intake:  1431 Output:      Net:  4174 24 Hr.:  1671  Oral Intake      In:  240  IV (Primary)      In:  1191  Length of Stay Totals Intake:  1926 Output:  100    Net:  [place] Aspect 81 y/o Caucasian male with mild dementia.   Present Illness 81 y/o Caucasian male who was admitted to Medicine for fever. Work-up included CT AP which showed right hip dysplasia and fluid in joint. Patient has history of polio with significant muscle atrophy and loss of function RLE. History of multiple knee surgeries including knee fusion. Has drop foot RLE.Marland Kitchen   Case History and Physical Exam:  Chief Complaint Fever   Past Medical Health Coronary Artery Disease, Hypertension, COPD   Past Surgical History Coronary Artery Bypass Graft  multiple knee surgeries   Family History Diabetes Mellitus   HEENT Hearing impairment, wears hearing aids, Head atraumatic, EOM intact   Neck/Nodes No Adenopathy   Chest/Lungs non-labored breathing, no  use of accessory  muscles, no wheezing   Breasts Not examined   Cardiovascular Normal Sinus Rhythm   Abdomen Benign   Genitalia Not examined   Rectal Not examined   Musculoskeletal RLE: Painless hip passive hip ROM 0-90 degrees, fused knee, drop foot, SILT DP/SP/sural/saphenous. No  open wounds, no erythema, no tenderness about hip.   Neurological mild dementia, polio RLE with significant muscle atrophy and loss of function   Skin Warm  WNL    Impression 81 y/o Caucasian male admitted for fever of unknown reasons.   Plan Given exam, likelihood for septic arthritis right hip low. Recommend obtaining ESR/CRP with next lab draw. If elevated and concerns persist, recommend IR guided aspiration with synovial cell count and cultures sent. Family also states that patients slided out of reclining chair while transferring to wheelchair and may have heard right hip last  week. Currently, no pain with any ROM of right hip.   Electronic Signatures: Harle Battiest (MD)  (Signed 27-Dec-14 21:57)  Authored: Health Issues, Significant Events - History, Home Medications, Labs, Radiology Results, Allergies, Vital Signs, General Aspect/Present Illness, History and Physical Exam, Impression/Plan   Last Updated: 27-Dec-14 21:57 by Harle Battiest (MD)

## 2015-04-14 NOTE — Discharge Summary (Signed)
PATIENT NAME:  Kenneth Ramos, Kenneth Ramos MR#:  [id number] DATE OF BIRTH:  Aug 19, 1922  DATE OF ADMISSION:  12/17/2013 DATE OF DISCHARGE:  12/19/2013  ADMITTING DIAGNOSIS: Fever and chills.   DISCHARGE DIAGNOSES: 1.  Systemic inflammatory response syndrome. 2.  Suspected bronchitis versus mild pneumonia. Unclear etiology at this time.  3.  Metabolic encephalopathy due to systemic inflammatory response syndrome, resolved.  4.  Hyponatremia due to dehydration, resolved on IV fluids.  5.  Neck pain.  6.  Right-sided hip area pain, likely degenerative arthrosis.  7.  Hypotension, resolved on IV fluids.  8.  History of hypertension.  9.  Poliomyelitis with right lower extremity weakness.  10.  History of chronic obstructive pulmonary disease.  11.  Coronary artery disease, status post coronary artery bypass grafting, stable.  12.  Dementia.   DISCHARGE CONDITION: Stable.   DISCHARGE MEDICATIONS: The patient is to resume his outpatient medications which are gemfibrozil 600 mg p.o. twice daily, sertraline 100 mg p.o. daily, amitriptyline 25 mg p.o. at bedtime, clopidogrel 75 mg p.o. daily, donepezil 10 mg daily, doxazosin 2 mg p.o. daily, metoprolol succinate 25 mg p.o. daily, fluticasone salmeterol 250/50 one puff twice daily, Combivent Respimat 1 puff four times daily as needed, Zithromax 250 mg once daily for 4 more days.   Home health will be ordered for him upon discharge.   Physical therapy nurse as well as nurse aide.   HOME OXYGEN: None.   DIET: Two grams salt, mechanical soft.   ACTIVITY LIMITATIONS: As tolerated.    FOLLOWUP APPOINTMENT: With Dr. Elizabeth Sauereanna Jones in 2 days after discharge.   CONSULTANTS: Orthopedic surgeon, Dr. Lovie MacadamiaSeyler, Dr. Ernest PineHooten, care management as well as social work.   RADIOLOGIC STUDIES: Chest x-ray, portable, single view on the 26th of December 2014, revealed no acute findings. Low lung volumes.   CT scan of abdomen and pelvis without contrast, the 26th of December  2014, due to right-sided flank pain as well as fever, showed no urolithiasis. No acute intra-abdominal or pelvic process was noted. Right hip dysplasia was noted with small right hip effusion which could be sampled if clinical concern for septic arthritis, though there are no associated destructive bony changes according to radiologist. Mild urinary bladder-wall thickening which may reflect the patient's known chronic cystitis, though limited assessment due to underdistention. A 17 mm left common iliac artery aneurysm in a background of moderate calcific atherosclerosis according to radiologist.   Repeated chest x-ray, PA and lateral,  28th of December 2014, revealed post median sternotomy, minimal bibasilar atelectasis.   Lumbar spine, AP and lateral, the 28th of December 2014, showed no acute osseous abnormality, degenerative disk disease and spondylosis at L3-L4 level, spondylosis at every lumbar level, and the visualized lower thoracic spine, degenerative changes involving the right sacroiliac joint.   CT of head without contrast, the 27th of December 2014, showed no acute intracranial process. Remote bilateral basal ganglia lacunar infarcts, right subinsular infarct with moderate white matter changes suggesting chronic small vessel ischemic disease.   HISTORY OF PRESENT ILLNESS: The patient is an 81 year old Caucasian male with past medical history significant for history of poliomyelitis as a child who has history of coronary artery disease, hypertension as well as dementia who presents to the hospital with complaints of fever, chills, as well as confusion. Please refer to Dr. Clarita LeberVasireddy's admission on the 27th of December 2014.   On arrival to the hospital, the patient's temperature was 101, pulse was 90. Respiration rate was 18, blood pressure  119/57. Saturation was 96% on room air.   PHYSICAL EXAM: Unremarkable.   The patient's lab data done in the Emergency Room on the 26th of December 2014  revealed sodium level low at 132. Glucose was 105; otherwise, BMP was unremarkable. The patient's calcium level was low at 7.8. Liver enzymes revealed albumin level of 3.3,  otherwise, unremarkable. Troponin level was less than 0.02. TSH was normal at 1.9. The patient's white blood cell count was normal at 8.1. Hemoglobin was 12.2, platelet count was 100. Absolute neutrophil count was not checked; however, the patient did have 6 bands, 78% of segmented neutrophils, 9 lymphocytes, 1 monocyte, 1 eosinophil, 3 metamyelocytes and 2 myelocytes.   Urine culture taken on the 26th of December 2014, showed no growth.   Influenza testing was negative.   Blood cultures taken on the 27th of December 2014 also showed no growth. The patient's urinalysis was remarkable for 19 red blood cells, 1 white blood cell, otherwise negative for protein, nitrites or leukocytes esterase.   The patient's lactic acid level was found to be normal at 1.2.   EKG showed normal sinus rhythm at 68 beats per minute, normal axis. No acute ST-T changes were noted;  however, signs of anteroseptal infarct which were already cited in April 2012 were noted.   BRIEF HOSPITAL COURSE: The patient was admitted to the hospital for further evaluation. Attempt to determine where his fevers were coming from was made. The patient had blood cultures taken as well as urine cultures and flu testing. He, however, was not initiated on any antibiotics until the 27th of December 2014. Initially the patient was started on Rocephin. Later his antibiotic was changed to vancomycin as well as erythromycin and Zosyn to cover broad-spectrum bacteria. With this, his condition somewhat improved .  All cultures, however, remained negative. We repeated his chest x-ray which showed mild atelectasis and this patient had some cough. We felt that the patient's fever could have been coming from mild pneumonia versus bronchitis; however, no sputum cultures were obtained.    The patient was advised to continue antibiotic therapy for a few more days to complete a 7-day course and follow up with his primary care physician as an outpatient.   Because of changes, which were noted in the right hip area on CT scan, we also involved Dr. Lovie Macadamia, orthopedic surgeon, as well as Dr. Ernest Pine, who recommended to get erythrocyte sedimentation rate, which was normal, as well as CPR, which was elevated, however, did not feel, based on clinical presentation that the patient had a septic joint.   The patient was advised to follow up with his primary physician and make decisions about further evaluation of his fever.   The patient was noted to have a mildly abnormal CBC. I discussed his case with Dr. Orlie Dakin who recommended a followup with him as outpatient. He felt that the patient's abnormalities on CBC could have been related to fever itself; however, if changes do not resolve in the next 1 month, Dr. Orlie Dakin felt that further investigation may be necessary.   In regard to encephalopathy, it was felt to be due to high fevers. When fever subsided, the patient's encephalopathy also resolved. The patient was noted to be hyponatremic. It was felt to be dehydration related. The patient was given IV fluids and his sodium level normalized.   In regard to back pain, as mentioned above, it was thought the patient's sacroiliac joint on the right as well as right-sided hip could  have been affect by degenerative arthrosis and that is what was suggested by CT scan, and Dr. Ernest Pine agreed to this notion and felt that no other investigation would be necessary at this point.   The patient was noted to be hypotensive on arrival to the hospital. His blood pressure medications were placed on hold. He was given IV fluids and his blood pressure normalized. Upon discharge, the patient is to resume his blood pressure medication.   For history of poliomyelitis as well as coronary artery disease and COPD, the  patient is to continue his outpatient management.   DISCHARGE CONDITION: Stable condition with the above-mentioned medications and followup.   VITAL SIGNS: On the day of discharge, temperature was 97.8. Pulse was 76. Respiration rate was 17 to 18, blood pressure 116 to 158 systolic and 60s to 70s diastolic. Oxygen saturation was 96% to 98% on room air at rest.   TIME SPENT: 40 minutes.   Of note, the patient was evaluated by physical therapy while he was in the hospital, and home health physical therapy was recommended. The patient is being discharged with home health RN, physical therapy, as well as aide.    ____________________________ Katharina Caper, MD rv:np D: 12/19/2013 17:31:24 ET T: 12/19/2013 21:00:17 ET JOB#: [id number]  cc: Katharina Caper, MD, <Dictator> Duanne Limerick, MD  Pierson Vantol MD ELECTRONICALLY SIGNED 01/02/2014 14:56

## 2015-04-14 NOTE — H&P (Signed)
PATIENT NAME:  Kenneth Ramos, MARCHETTA MR#:  [id number] DATE OF BIRTH:  11-04-1922  DATE OF ADMISSION:  09/06/2014  PRIMARY CARE PHYSICIAN:  Home Care.    CHIEF COMPLAINT: Brought in with fever.   HISTORY OF PRESENT ILLNESS:  A 81 year old man coming in with fever. Yesterday he was coughing up some green stuff. Home health nurse listened to his lungs, sounded okay. Last night he spiked a fever, they called the doctor on call and started some Augmentin. This morning still had a fever, at 4:00 p.m. he was harder to arouse and was not responding how he normally does, still coughing, and they decided to bring him in for further evaluation. In the ER his blood pressure was low on presentation, 76/63, pulse was 144. The patient was found to have an elevated white count at 29.3. Chest x-ray and urine were negative initially. The patient not able to give the best history, history obtained from daughter at the bedside.   PAST MEDICAL HISTORY: Coronary artery disease, COPD, hypertension, dementia, hypertriglyceridemia, had polio as a child, he is immobile and basically bedbound at this point, recent pneumonia.   PAST SURGICAL HISTORY: CABG and right knee.   ALLERGIES: LEVAQUIN AND SULFA.   MEDICATIONS: As per prescription writer include Advair Diskus 250/50 one inhalation twice a day, amitriptyline 25 mg at bedtime, Augmentin 875/125 one tablet twice a day for 10 days, Plavix 75 mg daily, Combivent CFC 1 puff 4 times a day as needed for shortness of breath, donezepil 10 mg daily, doxazosin 2 mg daily, gemfibrozil 600 mg twice a day, metoprolol tartrate 25 mg twice a day, ranitidine 150 mg in the morning, Zoloft 100 mg daily   SOCIAL HISTORY: Lives in his home, 1 of his daughters is with him 24/7, also has home health. Quit smoking many years ago. No alcohol. No drug use. Owned a [employer].   FAMILY HISTORY: Father died at 20 of heart disease, also had diabetes. Mother died at 23 of heart disease. Brother died of  lung cancer.   REVIEW OF SYSTEMS: Difficult to obtain secondary to altered mental status.   PHYSICAL EXAMINATION:  VITAL SIGNS: On presentation to the ER included a pulse of 144, respirations 30, blood pressure 76/63, pulse oximetry 98%. Last blood pressure 113/81, temperature up at 101.6, pulse oximetry is 92% on oxygen.  GENERAL: No respiratory distress.  EYES: Conjunctivae and lids normal. Pupils equal, round, and reactive to light. Unable to test extraocular muscles.  EARS, NOSE, MOUTH AND THROAT: Tympanic membranes, no erythema. Nasal mucosa, no erythema. Throat, no erythema, no exudate seen. Lips and gums, no lesions.  NECK: No JVD. No bruits. No lymphadenopathy. No thyromegaly. No thyroid nodules palpated.  RESPIRATORY: Lungs clear to auscultation. No use of accessory muscles to breathe. No rhonchi, rales, or wheeze heard.  CARDIOVASCULAR SYSTEM: S1 and S2, tachycardic. No gallops or rubs heard. A 2 out of 6 systolic ejection murmur. Carotid upstroke 2 + bilaterally. No bruits. Dorsalis pedis pulses 1 + bilaterally. Trace edema of the lower extremities.  ABDOMEN: Soft, nontender. No organo-splenomegaly. Normoactive bowel sounds. No masses felt.  LYMPHATIC: No lymph nodes in the neck.  MUSCULOSKELETAL: Trace edema. No clubbing. No cyanosis.  SKIN: No ulcers or lesions seen.  NEUROLOGICAL: Cranial nerves difficult to test secondary to altered mental status.   LABORATORY AND RADIOLOGICAL DATA: Lactic acid 3.5. CT scan of the head negative. Chest x-ray negative. Urinalysis negative. Troponin negative. INR 1.2. White blood cell count 29.3, H and  H 14.1 and 43.8, platelet count of 161,000. Glucose 135, BUN 18, creatinine 1.1, sodium 136, potassium 4.9, chloride 106, CO2 of 21, calcium 8.5. Liver function tests normal range. Albumin low at 3.0. Magnesium 2.1. Phosphorus 2.9.   ASSESSMENT AND PLAN:  1.  Clinical sepsis, likely pneumonia with coughing as a symptom, but since he is dehydrated,  likely not showing up on the initial chest x-ray. Will repeat a chest x-ray tomorrow after hydration. The patient has hypotension, leukocytosis, tachycardia, and fever. The patient does eat soft diet, high risk for aspiration. We will give Zosyn and Zithromax. ER physician already gave vancomycin and Zosyn at this point. We will get blood cultures, sputum culture if able.  2.  History of hypertension with hypotension, will have to hold metoprolol at this time.  3.  Hypertriglyceridemia. We will hold gemfibrozil.  4.  History of coronary artery disease. Continue Plavix.  5.  Dementia on Aricept.  6.  Chronic obstructive pulmonary disease on Advair Diskus.    TIME SPENT ON ADMISSION: 55 minutes.   Since the patient had responded to the fluid bolus  will try to keep him out of the ICU.  The patient is a Do Not Resuscitate. Overall prognosis is poor. Daughter had been deferred palliative care consultation.  The patient was seen by palliative care on the last admission.     ____________________________ Herschell Dimesichard J. Renae GlossWieting, MD rjw:bu D: 09/06/2014 21:11:49 ET T: 09/06/2014 21:18:49 ET JOB#: [id number]  cc: Herschell Dimesichard J. Renae GlossWieting, MD, <Dictator> Home Care Salley ScarletICHARD J Max Romano MD ELECTRONICALLY SIGNED 09/14/2014 13:57

## 2015-04-14 NOTE — Discharge Summary (Signed)
Dates of Admission and Diagnosis:  Date of Admission 12-May-2014   Date of Discharge 16-May-2014   Admitting Diagnosis sepsis- pneumonia.   Final Diagnosis sepsis- due to pneumonia Aspiration pneumonia- high risk for aspirations Htn Altered mental status due to metabolic encephalopathy due to Infection. Functional paraplegia- wheel chair bound at home. hx of CAD    Chief Complaint/History of Present Illness a 81 year old pleasant Caucasian male with a past medical history of coronary artery disease status post coronary artery bypass grafting, dementia, hypertension, and COPD who is brought into the ER with the chief complaint of fever of 102.4 degrees Fahrenheit, associated with productive cough and chills. He spiked a temperature of 102.4 degrees Fahrenheit yesterday and started coughing. The patient was not being himself yesterday. As daughter is concerned about the patient's fever and cough, she brought him into the ER. The patient is diagnosed with multilobar pneumonia. His lactic acid is elevated at 1.5. The patient was given Zosyn and vancomycin and hospitalist team is called to admit the patient. During my examination, the patient is reporting that he is feeling okay. Daughter is at bedside. He has chronic low platelet count which was investigated in the past by oncologist, Dr. Grayland Ormond, and no interventions were recommended at the time.   Allergies:  Levaquin: Agitation  Sulfa drugs: Hives  Hepatic:  22-May-15 01:42   Bilirubin, Total 0.4  Alkaline Phosphatase 66 (45-117 NOTE: New Reference Range 11/11/13)  SGPT (ALT)  8  SGOT (AST) 22  Total Protein, Serum 6.4  Albumin, Serum  3.0  Routine Micro:  23-May-15 13:07   Organism Name CANDIDA ALBICANS  Organism Quantity LIGHT GROWTH  Cefazolin Sensitivity R  Ceftriaxone Sensitivity S  Ciprofloxacin Sensitivity S  Gentamicin Sensitivity S  Ceftazidime Sensitivity S  Levofloxacin Sensitivity S  Trimethoprim/Sulfamethoxazole  Sensitivty S  Cefoxitin Sensitivity I  Micro Text Report SPUTUM CULTURE   ORGANISM 1                LIGHT GROWTH SERRATIA MARCESCENS   ORGANISM 2                LIGHT GROWTH CANDIDA ALBICANS   GRAM STAIN                FAIR SPECIMEN-70-80% WBC   GRAM STAIN                MANY WHITE BLOOD CELLS   GRAM STAIN                FEW GRAM POSITIVE COCCI IN PAIRS   GRAM STAIN                FEW GRAM POSITIVE ROD   ANTIBIOTIC                    ORG#1    ORG#2     CEFAZOLIN                     R                  CEFOXITIN                     I      CEFTAZIDIME                   S                  CEFTRIAXONE  S                  CIPROFLOXACIN                 S                  GENTAMICIN                    S                  LEVOFLOXACIN                  S     TRIMETHOPRIM/SULFAMETHOXAZOLE S  Organism 1 LIGHT GROWTH SERRATIA MARCESCENS  Organism 2 LIGHT GROWTH CANDIDA ALBICANS  Culture Comment ID TO FOLLOW FOR ORG #2  Gram Stain 1 FAIR SPECIMEN-70-80% WBC  Gram Stain 2 MANY WHITE BLOOD CELLS  Gram Stain 3 FEW GRAM POSITIVE COCCI IN PAIRS  Gram Stain 4 FEW GRAM POSITIVE ROD  Result(s) reported on 16 May 2014 at 11:49AM.  Routine Chem:  22-May-15 01:42   Glucose, Serum  103  BUN  20  Creatinine (comp)  1.40  Sodium, Serum 140  Potassium, Serum 3.9  Chloride, Serum  109  CO2, Serum 25  Calcium (Total), Serum  7.5  Anion Gap  6  Osmolality (calc) 282  eGFR (African American)  51  eGFR (Non-African American)  44 (eGFR values <98m/min/1.73 m2 may be an indication of chronic kidney disease (CKD). Calculated eGFR is useful in patients with stable renal function. The eGFR calculation will not be reliable in acutely ill patients when serum creatinine is changing rapidly. It is not useful in  patients on dialysis. The eGFR calculation may not be applicable to patients at the low and high extremes of body sizes, pregnant women, and vegetarians.)  B-Type Natriuretic Peptide  ([facility])  890 (Result(s) reported on 12 May 2014 at 02:21AM.)  26-May-15 03:43   Glucose, Serum  105  BUN 8  Creatinine (comp) 1.02  Sodium, Serum 139  Potassium, Serum  3.4  Chloride, Serum  109  CO2, Serum 25  Calcium (Total), Serum  7.8  Anion Gap  5  Osmolality (calc) 276  eGFR (African American) >60  eGFR (Non-African American) >60 (eGFR values <675mmin/1.73 m2 may be an indication of chronic kidney disease (CKD). Calculated eGFR is useful in patients with stable renal function. The eGFR calculation will not be reliable in acutely ill patients when serum creatinine is changing rapidly. It is not useful in  patients on dialysis. The eGFR calculation may not be applicable to patients at the low and high extremes of body sizes, pregnant women, and vegetarians.)  Cardiac:  22-May-15 01:42   Troponin I < 0.02 (0.00-0.05 0.05 ng/mL or less: NEGATIVE  Repeat testing in 3-6 hrs  if clinically indicated. >0.05 ng/mL: POTENTIAL  MYOCARDIAL INJURY. Repeat  testing in 3-6 hrs if  clinically indicated. NOTE: An increase or decrease  of 30% or more on serial  testing suggests a  clinically important change)  Routine Hem:  22-May-15 01:42   WBC (CBC)  18.8  RBC (CBC)  3.99  Hemoglobin (CBC)  12.1  Hematocrit (CBC)  38.3  Platelet Count (CBC)  90  MCV 96  MCH 30.4  MCHC  31.7  RDW  15.4  Neutrophil % 96.4  Lymphocyte % 2.6  Monocyte % 0.8  Eosinophil % 0.1  Basophil % 0.1  Neutrophil #  18.2  Lymphocyte #  0.5  Monocyte #  0.1  Eosinophil # 0.0  Basophil # 0.0 (Result(s) reported on 12 May 2014 at 02:40AM.)  23-May-15 04:49   WBC (CBC)  32.1  24-May-15 04:22   WBC (CBC)  13.8  26-May-15 03:43   WBC (CBC) 9.3  RBC (CBC)  2.85  Hemoglobin (CBC)  9.1  Hematocrit (CBC)  27.0  Platelet Count (CBC)  85 (Result(s) reported on 16 May 2014 at 07:18AM.)  MCV 95  MCH 32.0  MCHC 33.9  RDW  15.3  Bands 3  Segmented Neutrophils 80  Lymphocytes 7  Monocytes 2   Eosinophil 1  Metamyelocyte 5  Myelocyte 2  Diff Comment 1 ANISOCYTOSIS  Diff Comment 2 POIKILOCYTOSIS  Diff Comment 3 POLYCHROMASIA  Diff Comment 4 PLTS VARIED IN SIZE  Result(s) reported on 16 May 2014 at 07:18AM.   PERTINENT RADIOLOGY STUDIES: XRay:    22-May-15 02:12, Chest Portable Single View  Chest Portable Single View   REASON FOR EXAM:    coughing up blood  COMMENTS:       PROCEDURE: DXR - DXR PORTABLE CHEST SINGLE VIEW  - May 12 2014  2:12AM     CLINICAL DATA:  Cough and shortness of breath.  Blood in sputum.    EXAM:  PORTABLE CHEST - 1 VIEW    COMPARISON:  Chestx-ray 12/18/2013.    FINDINGS:  Lung volumes are low. There are some bibasilar opacities favored to  reflect subsegmental atelectasis, however, underlying airspace  consolidation from aspiration or infection is not excluded,  particularly in the leftlower lobe. Small left pleural effusion. No  evidence of pulmonary edema. Heart size is normal. The patient is  rotated to the left on today's exam, resulting in distortion of the  mediastinal contours and reduced diagnostic sensitivity and  specificity for mediastinal pathology. Atherosclerosis in the  thoracic aorta. Status post median sternotomy for CABG.     IMPRESSION:  1. Low lung volumes with probable bibasilar subsegmental  atelectasis, however, airspace consolidation from infection or  aspiration in the left lower lobe is difficult to exclude. Clinical  correlation is recommended.  2. Small left pleural effusion.  3. Atherosclerosis.  Electronically Signed    By: Vinnie Langton M.D.    On: 05/12/2014 02:12         Verified By: Etheleen Mayhew, M.D.,   Pertinent Past History:  Pertinent Past History COPD, coronary artery disease status post coronary artery bypass grafting, dementia, hypertension.   Hospital Course:  Hospital Course * Sepsis-  Fever with chills, leukocytosis, and Altered mental status on admission. CT chest has  revealed multilevel bronchopulmonary pneumonia. sputum cx- serratia- sensitive to Levaquin and bactrim- but pt is allergic- leaving only good chaoice to be ertapenem- once a day IV at home.   Pt have only low grade fevers up to 100.5 daily, WBCs improved, Likley that is drug fever- explained daughter.   Will need Aspiration precaution and puree diet as advised by SLP tech to daughter.   Spoke to CM to arrange For Home nurse for IV Abx for 8 days.    * metabolic encephalopathy- due to infection. better today.still not at his baseline as per daughter. CT head no acute findings. * Hypertension. Blood pressure slightly elevated- changed from Toprol Xl to Metoprolol BID. * hx of CAD- Plavix and metoprolol. *  Dementia. Continue his home medication. * delirium- due to infection and change in place-better now.   Condition on Discharge Stable   Code Status:  Code Status No Code/Do Not Resuscitate   DISCHARGE INSTRUCTIONS HOME MEDS:  Medication Reconciliation: Patient's Home Medications at Discharge:     Medication Instructions  sertraline 100 mg oral tablet  1 tab(s) orally once a day   amitriptyline 25 mg oral tablet  1 tab(s) orally once a day (at night)   clopidogrel 75 mg oral tablet  1 tab(s) orally once a day   donepezil 10 mg oral tablet  1 tab(s) orally once a day   doxazosin 2 mg oral tablet  1 tab(s) orally once a day   combivent respimat cfc free 100 mcg-20 mcg/inh inhalation aerosol  1 puff(s) inhaled 4 times a day, As Needed   probiotic formula - oral capsule  1 cap(s) orally once a day   metoprolol tartrate 25 mg oral tablet  1 tab(s) orally 2 times a day   acetaminophen 325 mg oral tablet  2 tab(s) orally every 4 hours, As needed, pain or temp. greater than 100.4   ondansetron 4 mg oral tablet, disintegrating  1 tab(s) orally every 6 hours, As Needed, nausea, vomiting , As needed, nausea, vomiting   megestrol 40 mg oral tablet  1 tab(s) orally once a day x 7 days   ertapenem   1 gram(s) intravenous every 24 hours x 8 days   ranitidine 150 mg oral tablet  1 tab(s) orally every 12 hours    STOP TAKING THE FOLLOWING MEDICATION(S):    gemfibrozil 600 mg oral tablet: 1 tab(s) orally 2 times a day tramadol 50 mg oral tablet: 1 tab(s) orally once a day (at bedtime) nasacort allergy 24hr 55 mcg/inh nasal spray: 2 spray(s) nasal once a day  Physician's Instructions:  Home Health? Yes   [place] Instructions PICC line care as routine, remove PICC line at end of IV abx therapy for 8 days.   Diet Low Sodium   Dietary Supplements Ensure Plus   Dietary Supplements Frequency Two times per day   Activity Limitations As tolerated   Return to Work Not Applicable   Time frame for Follow Up Appointment 1-2 weeks   Other Comments follow with PMD in 2 weeks.   Electronic Signatures: Vaughan Basta (MD)  (Signed 29-May-15 13:35)  Authored: ADMISSION DATE AND DIAGNOSIS, CHIEF COMPLAINT/HPI, Allergies, PERTINENT LABS, PERTINENT RADIOLOGY STUDIES, PERTINENT PAST HISTORY, HOSPITAL COURSE, DISCHARGE INSTRUCTIONS HOME MEDS, PATIENT INSTRUCTIONS   Last Updated: 29-May-15 13:35 by Vaughan Basta (MD)

## 2015-04-14 NOTE — H&P (Signed)
PATIENT NAME:  Kenneth Ramos, Kenneth Ramos MR#:  [id number] DATE OF BIRTH:  August 09, 1922  DATE OF ADMISSION:  12/17/2013  PRIMARY CARE PHYSICIAN: Kenneth Limerickeanna C. Jones, MD  REFERRING PHYSICIAN: Dorothea GlassmanPaul Malinda, MD  CHIEF COMPLAINT: Fever with chills and confusion.    HISTORY OF PRESENT ILLNESS: Mr. Kenneth Ramos is an 81 year old male with a history of hypertension, coronary artery disease, status post CABG, dementia, who presented to the Emergency Department with complaints of fever, chills. The patient's daughters closely monitor the patient. The patient was noted to be sleeping most of the day. In the evening, woke up and had his supper. After that, noted to have fever with chills. The patient had a fever of 102.8. Concerning this, the patient was brought to the Emergency Department. Workup in the Emergency Department with urinalysis, chest x-ray, no obvious signs are found. Differential shows 78% neutrophils with 6% of bands. CT abdomen and pelvis showed apparent right hip dysplasia with a small right hip effusion. This could be concern about septic arthritis. The patient has been complaining of back pain for the last 1 week. Influenza is negative.   PAST MEDICAL HISTORY:  1. COPD. 2. Coronary artery disease, status post coronary artery bypass grafting.  3. Dementia.   ALLERGIES: LEVOFLOXACIN.   HOME MEDICATIONS:  1. Sertraline 100 mg once a day. 2. Toprol-XL 25 mg 2 times a day.  3. Loperamide 2 mg as needed.  4. Gemfibrozil 600 mg 2 times a day. 5. Doxazosin 2 mg once a day.  6. Donepezil 10 mg once a day.  7. Plavix 75 mg once a day.  8. Amitriptyline 25 mg once a day.   SOCIAL HISTORY: Remote history of smoking. No history of alcohol or drug use. Lives by himself; however, one of his daughters stays with him. The patient is wheelchair bound.   FAMILY HISTORY: Both parents with diabetes mellitus.   REVIEW OF SYSTEMS: Could not be obtained from the patient secondary to dementia and altered mental status.    PHYSICAL EXAMINATION:  GENERAL: This is a well-built, well-nourished, age-appropriate male lying down in the bed, not in distress, pleasantly confused.  VITAL SIGNS: Temperature 101, pulse 90, blood pressure 119/57, respiratory rate of 18, oxygen saturation is 96% on room air.  HEENT: Head normocephalic, atraumatic. Eyes: No sclerae icterus. Conjunctivae normal. Pupils equal and reactive to light. Extraocular movements are intact. Mucous membranes moist. No pharyngeal erythema.  NECK: Supple. No lymphadenopathy. No JVD. No carotid bruits.  CHEST: Has no focal tenderness. Occasional crackles are heard.  HEART: S1, S2, regular. No murmurs are heard.  ABDOMEN: Bowel sounds present. Soft, nontender, nondistended. No hepatosplenomegaly.  EXTREMITIES: No pedal edema. Pulses 2+.  NEUROLOGIC: The patient is oriented to self, but not to place and time. No apparent cranial nerve abnormalities. Motor 5/5 in upper and lower extremities. Could not examine the sensory.   LABORATORY AND IMAGING DATA:  CBC: WBC of 8.1, hemoglobin 12.2, platelet count of 100.  BMP is completely within normal limits.  Influenza is negative.  CT head without contrast showed remote bilateral basal ganglia lacunar infarcts, right subinsular infarct with moderate white matter changes, suggesting chronic small vessel ischemic disease.  Lactic acid 1.2.  CT abdomen and pelvis, as mentioned above, concern about the edema of the right hip.   ASSESSMENT AND PLAN: Mr. Kenneth Ramos is an 81 year old male who is brought to the Emergency Department with fever and chills.   1. Fever and chills. No obvious source of infection is found. There  is a possibility of septic joint of the right hip; however, did not have any obvious pain or decreased range of motion. No CVA tenderness. Has mild cough with occasional crackles. The patient has bandemia. Will keep the patient on Rocephin and Zithromax. Obtain blood cultures.  2. Hypertension. Will give the  Toprol-XL once a day.  3. Dementia. Continue the home medications.  4. Keep the patient on deep vein thrombosis prophylaxis with Lovenox.  5. Confusion secondary to the infection.   TIME SPENT: 45 minutes.   ____________________________ Susa Griffins, MD pv:lb D: 12/17/2013 01:55:09 ET T: 12/17/2013 05:41:50 ET JOB#: [id number]  cc: Susa Griffins, MD, <Dictator> Kenneth Limerick, MD Susa Griffins MD ELECTRONICALLY SIGNED 12/30/2013 21:16

## 2015-04-14 NOTE — H&P (Signed)
PATIENT NAME:  Kenneth Ramos, Kenneth Ramos MR#:  [id number] DATE OF BIRTH:  Nov 01, 1922  DATE OF ADMISSION:  05/12/2014  PRIMARY CARE PHYSICIAN: Elizabeth Sauereanna Jones, MD  REFERRING EMERGENCY ROOM PHYSICIAN: Dr. Manson PasseyBrown  CHIEF COMPLAINT: Fever and chills, confusion, and cough.   HISTORY OF PRESENT ILLNESS: The patient is a 81 year old pleasant Caucasian male with a past medical history of coronary artery disease status post coronary artery bypass grafting, dementia, hypertension, and COPD who is brought into the ER with the chief complaint of fever of 102.4 degrees Fahrenheit, associated with productive cough and chills. He spiked a temperature of 102.4 degrees Fahrenheit yesterday and started coughing. The patient was not being himself yesterday. As daughter is concerned about the patient's fever and cough, she brought him into the ER. The patient is diagnosed with multilobar pneumonia. His lactic acid is elevated at 1.5. The patient was given Zosyn and vancomycin and hospitalist team is called to admit the patient. During my examination, the patient is reporting that he is feeling okay. Daughter is at bedside. He has chronic low platelet count which was investigated in the past by oncologist, Dr. Orlie DakinFinnegan, and no interventions were recommended at the time.  PAST MEDICAL HISTORY: COPD, coronary artery disease status post coronary artery bypass grafting, dementia, hypertension.   PAST SURGICAL HISTORY: CABG.  ALLERGIES: LEVAQUIN, SULFA.   PSYCHOSOCIAL HISTORY: Lives at home. Two daughters take care of him constantly. The patient is wheelchair bound. No history of smoking, alcohol abuse or illicit drug usage.  FAMILY HISTORY: Both parents have diabetes mellitus.   HOME MEDICATIONS: Tramadol 50 mg p.o. once a day, Zoloft 100 mg p.o. once daily, probiotic formula 1 capsule p.o. once a day, Nasacort 2 sprays nasally once a day, metoprolol 25 mg p.o. once daily, gemfibrozil 600 mg 1 tablet p.o. b.i.d., doxazosin 2 mg p.o.  once a day, donepezil 10 mg p.o. once daily, Combivent 1 puff inhalation 4 times a day, Plavix 75 mg once a day, amitriptyline 25 mg once daily.  REVIEW OF SYSTEMS: Cannot be obtained as the patient is off from his baseline and has chronic history of dementia.  PHYSICAL EXAMINATION: VITAL SIGNS: Temperature 97.6, pulse 75, respirations 20, blood pressure 97/56, pulse ox 94%.  GENERAL APPEARANCE: Not in acute distress. Moderately built and nourished.  HEENT: Normocephalic, atraumatic. Pupils are equally reacting to light and accommodation. No scleral icterus. No conjunctival injection. No sinus tenderness. Moist mucous membranes.  NECK: Supple. No JVD. No lymphadenopathy. No carotid bruits.  LUNGS: Moderate air entry. Bilateral crackles. No wheezing.  HEART: S1 and S2, regular rate and rhythm. No murmurs.  ABDOMEN: Soft. Bowel sounds are positive in all 4 quadrants. Nontender, nondistended. No hepatosplenomegaly. No masses felt.  NEUROLOGIC: The patient is oriented to self, but not to place and time. Motor and sensory is intact. Cranial nerves are grossly intact. Motor seems to be at his baseline.  PSYCHIATRIC: Mood and affect could not be elicited as the patient is demented.  DIAGNOSTIC DATA: Glucose 103. BNP is elevated at 890. BUN 20,  sodium 140, potassium 3.9, chloride 109, CO2 25. GFR 44. Anion gap is 6.  Serum osmolality is normal. Calcium is 7.5. LFTs: Albumin is 3. Troponin less than 0.02. WBC 18.8, hemoglobin 12.1, hematocrit 38.3, platelet count 190. Lactic acid is 1.5.   CT angiogram of the chest to rule out pulmonary embolism: It is a limited examination demonstrating no center lobar or proximal segmental sized pulmonary embolism. Multifocal airspace consolidation most severe in the  lower lobes of the lung bilaterally suspicious for sequelae of recent aspiration or developing multilobar bronchopneumonia. Severe tracheobronchomalacia. Small hiatal hernia. Atherosclerosis including left  main and three-vessel coronary artery disease, status post median sternotomy for CABG including LIMA to LAD.   ASSESSMENT AND PLAN: A 81 year old Caucasian male brought into the ER with the chief complaint of fever, chills, cough, and shortness of breath, also with congestion. Will be admitted with the following assessment and plan: 1.  Fever with chills, leukocytosis, and hypertension. CT chest has revealed multilevel bronchopulmonary pneumonia. We will admit him to tele bed. We will provide him Zosyn with the patient being allergic to levofloxacin, erythromycin, and vancomycin.  2.  Hypertension. Blood pressure is low-normal at this time. The patient will be receiving Toprol-XL with holding parameters.  3.  Dementia. Continue his home medication.  4.  We will provide gastrointestinal prophylaxis and deep vein thrombosis prophylaxis with SCDs only in view of thrombocytopenia.   The patient is DNR. Daughter is the medical power of attorney. Diagnosis and plan of care was discussed in detail with the patient's daughter at bedside. She voided understanding of the plan.   TOTAL TIME SPENT ON ADMISSION: 50 minutes.    ____________________________ Ramonita Lab, MD ag:sb D: 05/12/2014 07:46:43 ET T: 05/12/2014 08:41:18 ET JOB#: [id number]  cc: Ramonita Lab, MD, <Dictator> Duanne Limerick, MD Ramonita Lab MD ELECTRONICALLY SIGNED 05/25/2014 7:58

## 2015-04-14 NOTE — Discharge Summary (Signed)
PATIENT NAME:  Kenneth Ramos, BUCKLE MR#:  [id number] DATE OF BIRTH:  1922-11-17  DATE OF ADMISSION:  09/06/2014 DATE OF DISCHARGE:  09/08/2014   CHIEF COMPLAINT AT THE TIME OF ADMISSION:  Fever.   ADMITTING DIAGNOSES:  1.  Clinical sepsis, likely pneumonia.  2.  Hypertension with hypotension. 3.  Hypertriglyceridemia. 4.  History of coronary artery disease. 5.  Dementia. 6.  Chronic obstructive pulmonary disease.  DISCHARGE DIAGNOSES:  1.  Acute hypotension.  probably from dehydration and acute viral syndrome.  2.  Sepsis: Probably secondary to acute viral syndrome and acute bronchitis.   SECONDARY DISCHARGE DIAGNOSES:  1.  Hypertriglyceridemia.  2.  Coronary artery disease. 3.  Chronic dementia on Aricept.   4.  History of polio, bedbound.   CONSULTATIONS:  Palliative care, change his CODE STATUS to DNR and the patient was discharged home with home hospice.   PROCEDURES: None.   BRIEF HISTORY  AND HOSPITAL COURSE:  The patient is a 81 year old male brought into the ED with a chief complaint of fever.  The patient was coughing up some green stuff. Please review Dr. Mathews Robinsons history and physical.  The patient was admitted to the hospital with a diagnosis of clinical sepsis, likely from pneumonia.  Initial chest x-ray was negative.   HOSPITAL COURSE:  1.  Sepsis was initially assumed from pneumonia.  Initial chest x-ray and repeat chest x-ray were negative.  At the time of admission, the patient was hypotensive with leukocytosis and tachycardia and fever.  The patient was started on IV Zosyn and Zithromax.  All cultures turned out to be negative.  Both chest x-rays were negative.  The sepsis was assumed to be from acute bronchitis and acute viral syndrome.  Zosyn was discontinued and patient was discharged to home with p.o. Zithromax after he was clinically stable.  2.  Hypotension. Was resolved with IV fluids.  His home medication and beta blocker was held.   3.  Gemfibrozil was continued  for hypertriglyceridemia. Aricept was continued for dementia. The patient has chronic history of polio and he is somewhat bedbound with less activity as reported by the family members.    Palliative care was consulted as requested by doctor and the patient was subsequently discharged home with hospice once he was clinically stable.   LABORATORY DATA: Initial white count was at 29.3 on 09/06/2014 and on 09/08/2014 it was at 9.6.  On 08/29/2014, hemoglobin 10.1, hematocrit 30.6, platelet count was 104.  Blood cultures were negative with no growth.  Urine culture was negative.  Urinalysis is negative and nitrates and leukocytes are negative. Lactic acid at the time of admission was at 3.5.  CAT scan of the head without contrast: No acute intracranial findings.  Chest x-ray on 09/06/2014, portable single view: No lung volumes and probable subsegmental atelectasis possible small left-sided pleural effusion.  Repeat chest x-ray, portable on 09/07/2014 left-sided pleural effusion otherwise, no active cardiopulmonary disease.   MEDICATIONS AT THE TIME OF DISCHARGE:  Zoloft 100 mg p.o. once daily, Plavix 75 mg p.o. once a day, doxazosin 2 mg p.o. once daily, Combivent 1 puff inhalation 4 times a day as needed for shortness of breath, amitriptyline 25 mg 1 tablet p.o. at bedtime, donepezil 10 mg 1 tablet p.o. once daily at bedtime, ranitidine 150 mg once daily in a.m. gemfibrozil 600 mg 1 tablet p.o. b.i.d., Advair 250/50 at 1 puff inhalation 2 times a day, Tylenol 2 tablets p.o. every 4 hours as needed for mild pain, azithromycin  5-day dose pack take p.o. as directed.  His home medications of metoprolol and Augmentin were discontinued.   DIET: Low-fat, low-cholesterol.  Diet with pudding-thick liquids as recommended by speech pathology.   ACTIVITY: As tolerated.   FOLLOW-UP APPOINTMENT:  Primary care physician in 1 to 2 weeks.   Plan of care was discussed in detail with the patient's 2 daughters.  They voiced  understanding of the plan.    TOTAL TIME SPENT ON DISCHARGE: 45 minutes.     ____________________________ Ramonita LabAruna Frankie Scipio, MD ag:DT D: 09/12/2014 14:04:00 ET T: 09/12/2014 14:27:09 ET JOB#: [id number]  cc: Ramonita LabAruna Jazyah Butsch, MD, <Dictator> Ramonita LabARUNA Aarvi Stotts MD ELECTRONICALLY SIGNED 09/20/2014 16:24

## 2015-08-15 IMAGING — CT CT HEAD WITHOUT CONTRAST
1 series · 16 of 30 positions shown, 20 images · non-contrast
Comparison: 05/15/2014

CLINICAL DATA: Weakness, generalized, confusion

EXAM:
CT HEAD WITHOUT CONTRAST
TECHNIQUE: Contiguous axial images were obtained from the base of the skull
through the vertex without intravenous contrast.

[Series 2: head wo · axial · 0.46mm/px · z∈[-168,-6]mm · 16 of 40 slices shown, 20 images]
[im 2/40  brain]
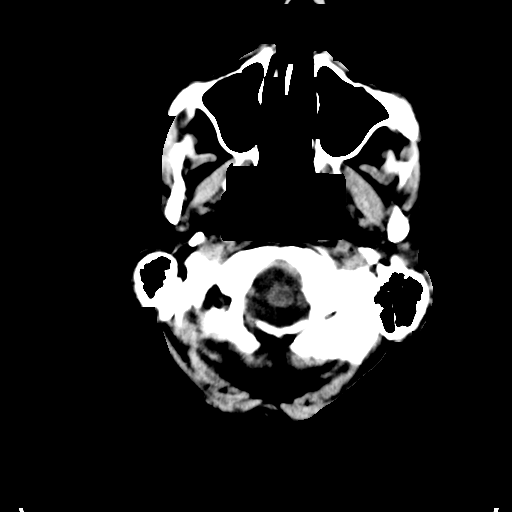
[im 2/40  bone]
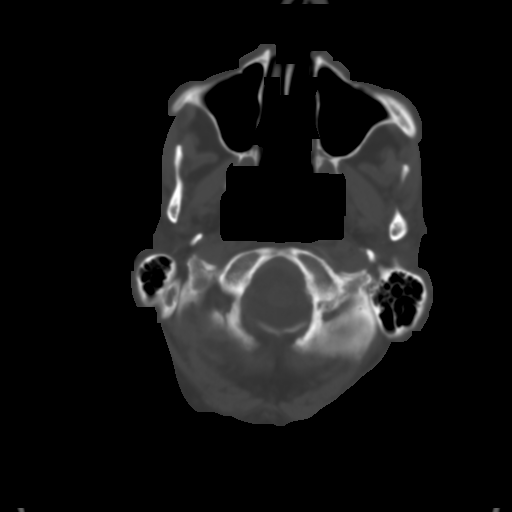
[im 5/40  brain]
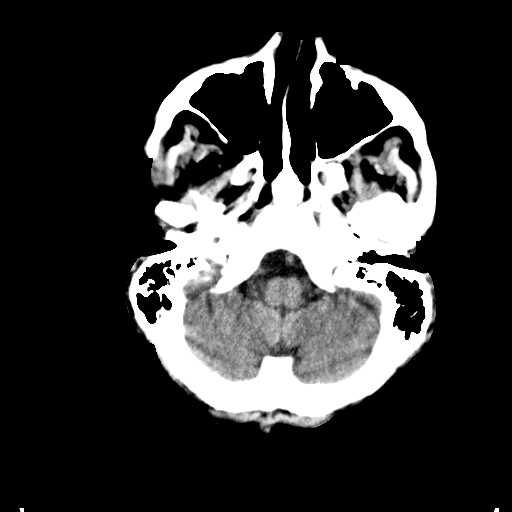
[im 7/40  brain]
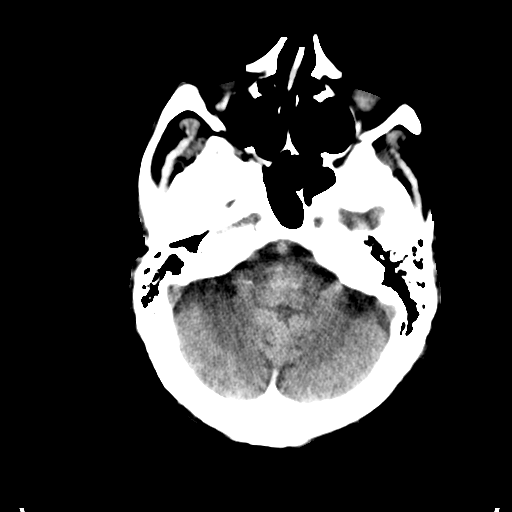
[im 10/40  brain]
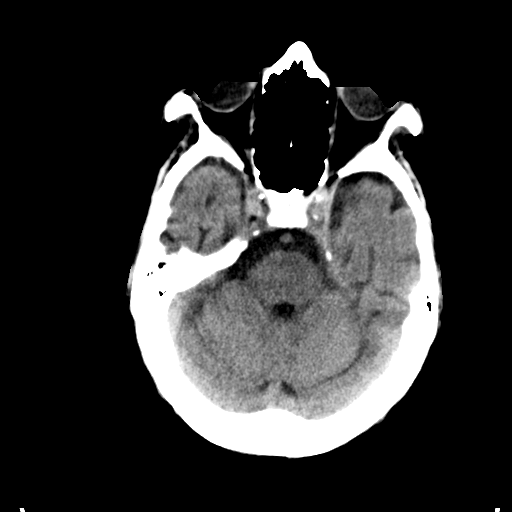
[im 11/40  brain]
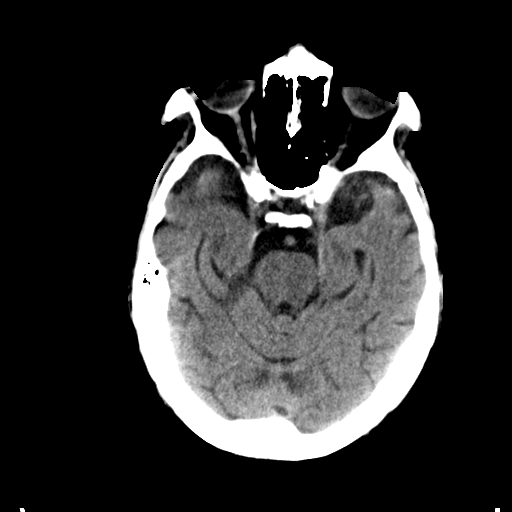
[im 11/40  bone]
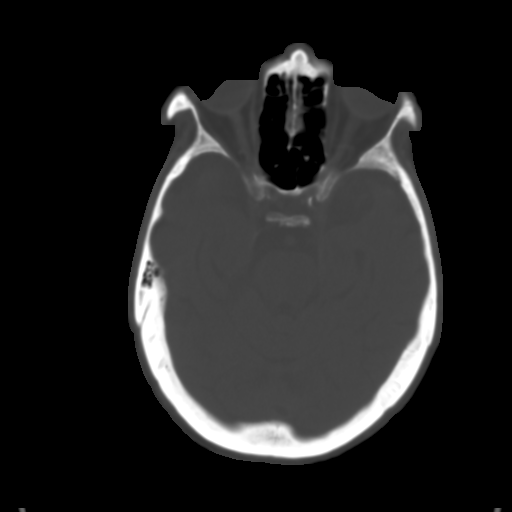
[im 14/40  brain]
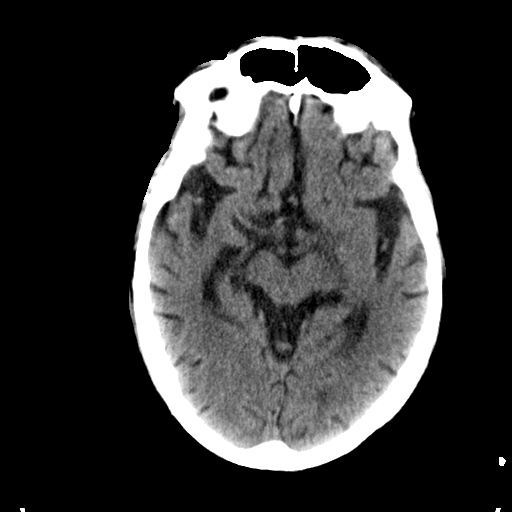
[im 17/40  brain]
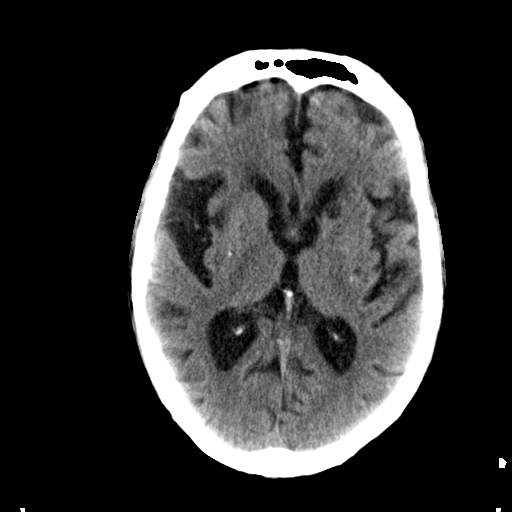
[im 19/40  brain]
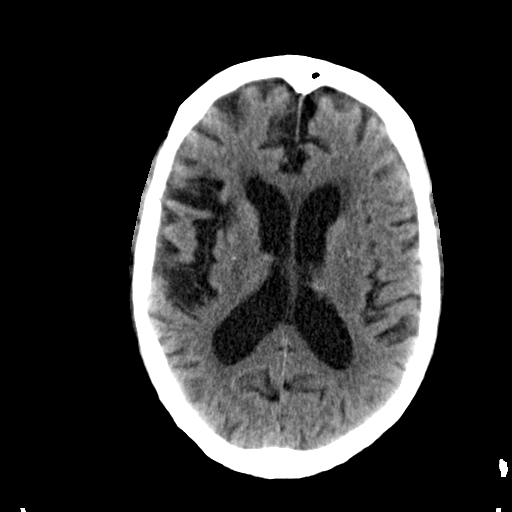
[im 21/40  brain]
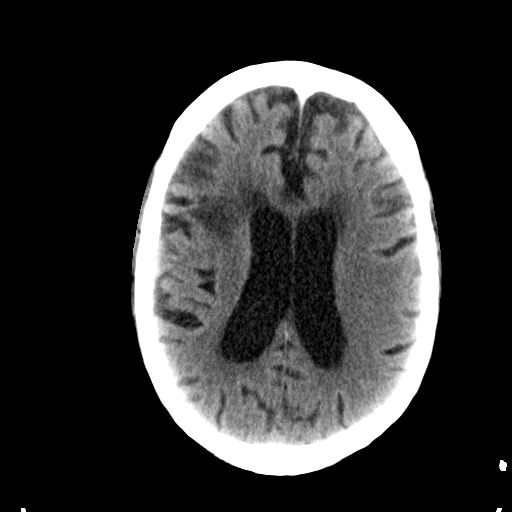
[im 21/40  bone]
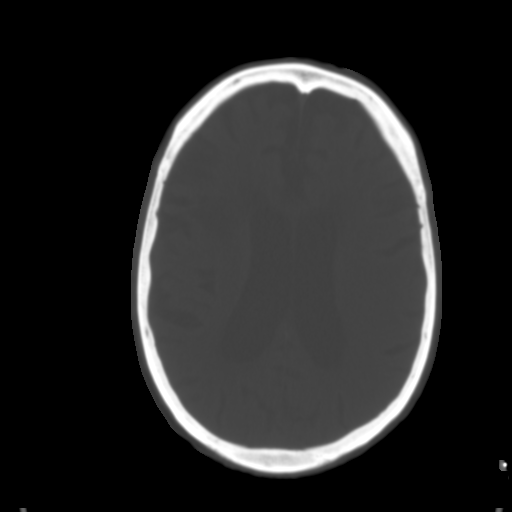
[im 23/40  brain]
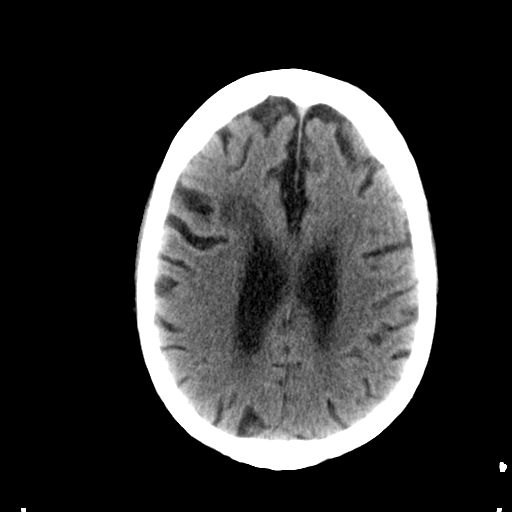
[im 26/40  brain]
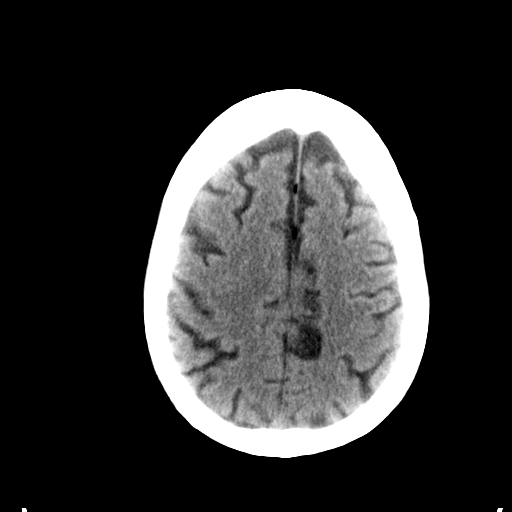
[im 29/40  brain]
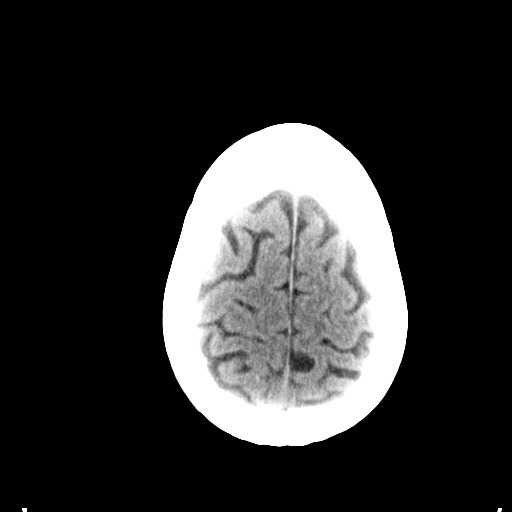
[im 30/40  brain]
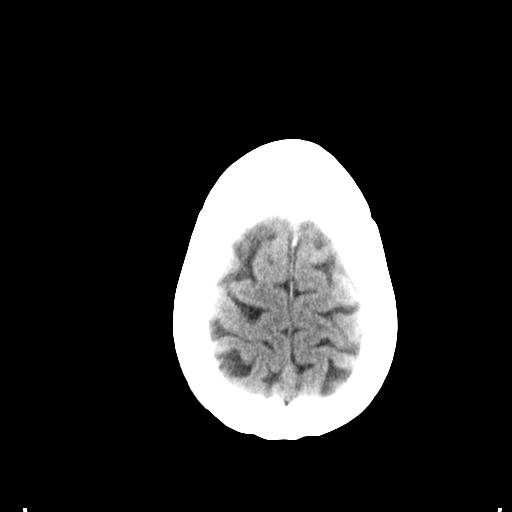
[im 30/40  bone]
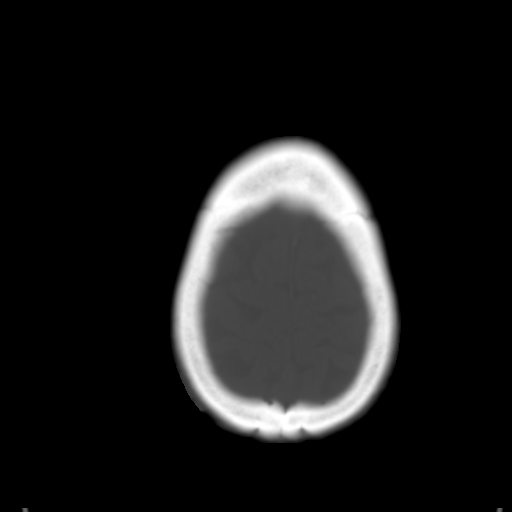
[im 33/40  brain]
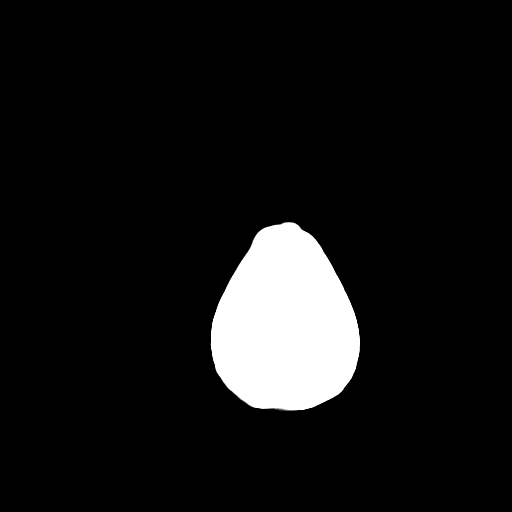
[im 35/40  brain]
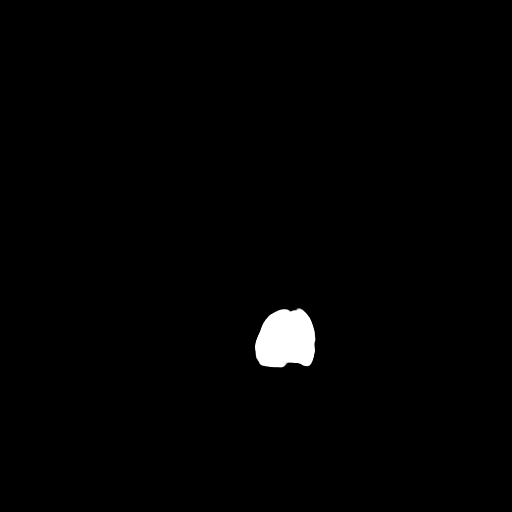
[im 38/40  brain]
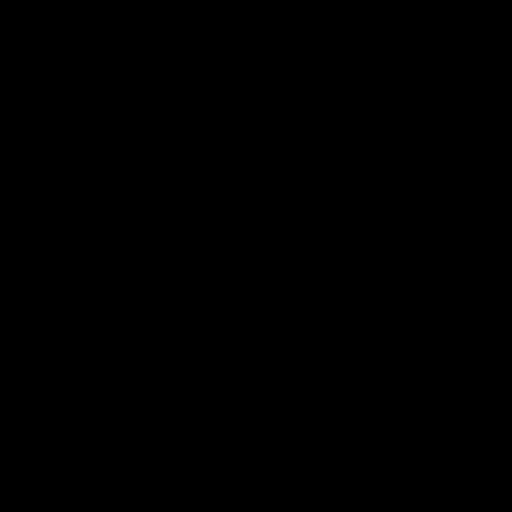

[16 of 30 positions shown; findings below may reference images not displayed]

FINDINGS: Punctate basal ganglial hyperdensities are reidentified bilaterally.
Evidence of prior right external capsule infarct. Mild cortical
volume loss noted with proportional ventricular prominence. Areas of
periventricular white matter hypodensity are most compatible with
small vessel ischemic change. No acute hemorrhage, infarct, or mass
lesion is identified. No midline shift. Foci of fat within the
interhemispheric fissure anteriorly reidentified. No skull fracture.
Orbits and paranasal sinuses are unremarkable.
IMPRESSION: No acute intracranial finding.

## 2015-08-16 IMAGING — CR DG CHEST 1V PORT
1 series · 1 of 1 positions shown · non-contrast
Comparison: 09/06/2014

CLINICAL DATA: Sepsis

EXAM:
PORTABLE CHEST - 1 VIEW

[ap]
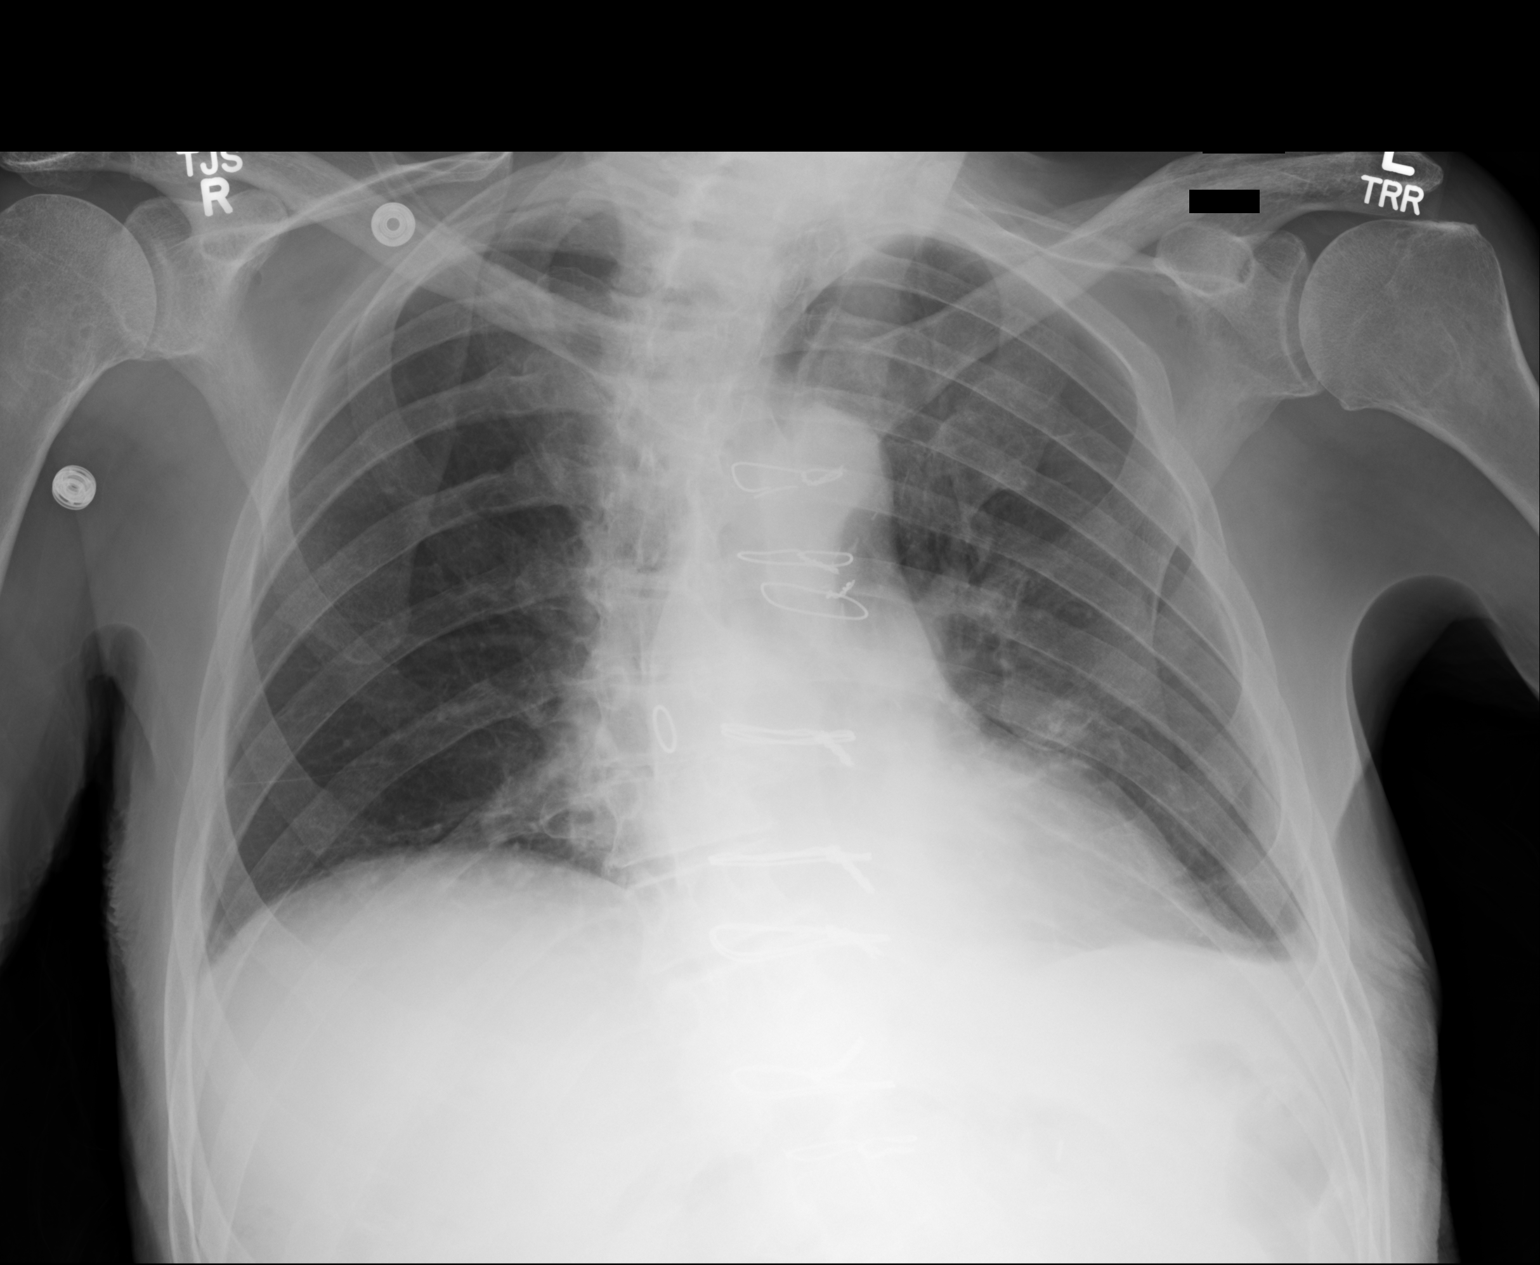

[1 of 1 positions shown; findings below may reference images not displayed]

FINDINGS: Trace left pleural effusion. No focal consolidation, right pleural
effusion or pneumothorax. Stable cardiomediastinal silhouette. Prior
CABG. Unremarkable osseous structures.
IMPRESSION: Trace left pleural effusion. Otherwise no active cardiopulmonary
disease.
